# Patient Record
Sex: Female | Born: 1945 | Race: White | Hispanic: No | Marital: Married | Smoking: Former smoker
Health system: Southern US, Community
[De-identification: ages and names within clinical notes are randomized; demographics above are authoritative.]

## PROBLEM LIST (undated history)

## (undated) DIAGNOSIS — F329 Major depressive disorder, single episode, unspecified: Secondary | ICD-10-CM

## (undated) DIAGNOSIS — M8580 Other specified disorders of bone density and structure, unspecified site: Secondary | ICD-10-CM

## (undated) DIAGNOSIS — E785 Hyperlipidemia, unspecified: Secondary | ICD-10-CM

## (undated) DIAGNOSIS — G4730 Sleep apnea, unspecified: Secondary | ICD-10-CM

## (undated) DIAGNOSIS — F419 Anxiety disorder, unspecified: Secondary | ICD-10-CM

## (undated) DIAGNOSIS — F32A Depression, unspecified: Secondary | ICD-10-CM

## (undated) DIAGNOSIS — M1990 Unspecified osteoarthritis, unspecified site: Secondary | ICD-10-CM

## (undated) DIAGNOSIS — E559 Vitamin D deficiency, unspecified: Secondary | ICD-10-CM

## (undated) DIAGNOSIS — I10 Essential (primary) hypertension: Secondary | ICD-10-CM

## (undated) DIAGNOSIS — Z8541 Personal history of malignant neoplasm of cervix uteri: Secondary | ICD-10-CM

## (undated) DIAGNOSIS — J449 Chronic obstructive pulmonary disease, unspecified: Secondary | ICD-10-CM

## (undated) HISTORY — DX: Depression, unspecified: F32.A

## (undated) HISTORY — DX: Anxiety disorder, unspecified: F41.9

## (undated) HISTORY — DX: Essential (primary) hypertension: I10

## (undated) HISTORY — DX: Personal history of malignant neoplasm of cervix uteri: Z85.41

## (undated) HISTORY — DX: Unspecified osteoarthritis, unspecified site: M19.90

## (undated) HISTORY — DX: Hyperlipidemia, unspecified: E78.5

## (undated) HISTORY — DX: Vitamin D deficiency, unspecified: E55.9

## (undated) HISTORY — DX: Major depressive disorder, single episode, unspecified: F32.9

## (undated) HISTORY — DX: Chronic obstructive pulmonary disease, unspecified: J44.9

## (undated) HISTORY — DX: Sleep apnea, unspecified: G47.30

## (undated) HISTORY — DX: Other specified disorders of bone density and structure, unspecified site: M85.80

---
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
## 1979-12-21 HISTORY — PX: CHOLECYSTECTOMY: SHX55

## 1980-12-20 HISTORY — PX: ABDOMINAL HYSTERECTOMY: SHX81

## 2001-12-20 LAB — HM COLONOSCOPY

## 2013-02-22 LAB — HM PAP SMEAR: HM Pap smear: NEGATIVE

## 2016-12-24 LAB — LIPID PANEL
Cholesterol: 153 (ref 0–200)
Cholesterol: 153 (ref 0–200)
HDL: 61 (ref 35–70)
LDL Cholesterol: 77
Triglycerides: 104 (ref 40–160)

## 2017-02-17 LAB — HM MAMMOGRAPHY

## 2017-02-17 LAB — HM DEXA SCAN: HM Dexa Scan: ABNORMAL

## 2017-07-12 ENCOUNTER — Encounter: Payer: Self-pay | Admitting: Nurse Practitioner

## 2017-07-12 ENCOUNTER — Ambulatory Visit (INDEPENDENT_AMBULATORY_CARE_PROVIDER_SITE_OTHER): Payer: Medicare Other | Admitting: Nurse Practitioner

## 2017-07-12 VITALS — BP 125/61 | HR 87 | Temp 98.1°F | Ht 63.0 in | Wt 175.2 lb

## 2017-07-12 DIAGNOSIS — F32A Depression, unspecified: Secondary | ICD-10-CM | POA: Insufficient documentation

## 2017-07-12 DIAGNOSIS — F324 Major depressive disorder, single episode, in partial remission: Secondary | ICD-10-CM

## 2017-07-12 DIAGNOSIS — E785 Hyperlipidemia, unspecified: Secondary | ICD-10-CM | POA: Diagnosis not present

## 2017-07-12 DIAGNOSIS — E559 Vitamin D deficiency, unspecified: Secondary | ICD-10-CM

## 2017-07-12 DIAGNOSIS — F329 Major depressive disorder, single episode, unspecified: Secondary | ICD-10-CM | POA: Insufficient documentation

## 2017-07-12 DIAGNOSIS — J449 Chronic obstructive pulmonary disease, unspecified: Secondary | ICD-10-CM | POA: Insufficient documentation

## 2017-07-12 DIAGNOSIS — Z7689 Persons encountering health services in other specified circumstances: Secondary | ICD-10-CM

## 2017-07-12 DIAGNOSIS — J42 Unspecified chronic bronchitis: Secondary | ICD-10-CM | POA: Diagnosis not present

## 2017-07-12 DIAGNOSIS — Z8541 Personal history of malignant neoplasm of cervix uteri: Secondary | ICD-10-CM | POA: Insufficient documentation

## 2017-07-12 DIAGNOSIS — M8589 Other specified disorders of bone density and structure, multiple sites: Secondary | ICD-10-CM | POA: Diagnosis not present

## 2017-07-12 DIAGNOSIS — I10 Essential (primary) hypertension: Secondary | ICD-10-CM | POA: Diagnosis not present

## 2017-07-12 DIAGNOSIS — M8580 Other specified disorders of bone density and structure, unspecified site: Secondary | ICD-10-CM | POA: Insufficient documentation

## 2017-07-12 LAB — COMPREHENSIVE METABOLIC PANEL
ALT: 17 U/L (ref 6–29)
AST: 18 U/L (ref 10–35)
Albumin: 4.3 g/dL (ref 3.6–5.1)
Alkaline Phosphatase: 117 U/L (ref 33–130)
BUN: 15 mg/dL (ref 7–25)
CO2: 24 mmol/L (ref 20–31)
Calcium: 9.6 mg/dL (ref 8.6–10.4)
Chloride: 102 mmol/L (ref 98–110)
Creat: 0.65 mg/dL (ref 0.50–0.99)
Glucose, Bld: 87 mg/dL (ref 65–99)
Potassium: 4.3 mmol/L (ref 3.5–5.3)
Sodium: 137 mmol/L (ref 135–146)
Total Bilirubin: 0.5 mg/dL (ref 0.2–1.2)
Total Protein: 6.7 g/dL (ref 6.1–8.1)

## 2017-07-12 MED ORDER — LISINOPRIL 30 MG PO TABS: 30.0000 mg | ORAL_TABLET | Freq: Every day | ORAL | 1 refills | Status: DC

## 2017-07-12 MED ORDER — SIMVASTATIN 40 MG PO TABS: 40.0000 mg | ORAL_TABLET | Freq: Every day | ORAL | 1 refills | Status: DC

## 2017-07-12 MED ORDER — BUPROPION HCL ER (XL) 300 MG PO TB24: 300.0000 mg | ORAL_TABLET | Freq: Every day | ORAL | 1 refills | Status: DC

## 2017-07-12 MED ORDER — ALBUTEROL SULFATE HFA 108 (90 BASE) MCG/ACT IN AERS: 1.0000 | INHALATION_SPRAY | Freq: Four times a day (QID) | RESPIRATORY_TRACT | 2 refills | Status: DC | PRN

## 2017-07-12 MED ORDER — BUDESONIDE-FORMOTEROL FUMARATE 160-4.5 MCG/ACT IN AERO: 2.0000 | INHALATION_SPRAY | Freq: Two times a day (BID) | RESPIRATORY_TRACT | 5 refills | Status: DC

## 2017-07-12 NOTE — Assessment & Plan Note (Signed)
Controlled in office today.  Pt reports long-term stable dose of medications.  Follows low salt diet.  Plan: 1. Continue lisinopril 30 mg once daily 2. Continue cholesterol and aspirin. 3. Last labs approximately months ago.  Recheck CMP today.  Pt not fasting - defer lipid to next appointment. 4. Encouraged heart healthy diet and regular exercise. 5. Follow up 6 months.

## 2017-07-12 NOTE — Assessment & Plan Note (Signed)
Pt w/ history of vitamin D deficiency and currently taking 5,000 IU Vitamin D3.  No recent check of vitamin D levels w/ labs from January and March.  Plan: 1. Continue vitamin D3 5,000 IU for now. 2. Recheck vitamin D, 25 today assess need for current vitamin D dosage. 3. Follow up 6 months - 1 year.

## 2017-07-12 NOTE — Patient Instructions (Addendum)
Lindsey Webb, Thank you for coming in to clinic today.  1. For your medicines: - We are not making any changes today.  - Get back on your Symbicort and let me know if you are still having shortness of breath after 2 weeks of restarting this medication.   Please schedule a follow-up appointment with Wilhelmina McardleLauren Jonah Nestle, AGNP. Return in about 6 months (around 01/12/2018) for COPD, BP, cholesterol.  If you have any other questions or concerns, please feel free to call the clinic or send a message through MyChart. You may also schedule an earlier appointment if necessary.  You will receive a survey after today's visit either digitally by e-mail or paper by [employer] mail. Your experiences and feedback matter to us.  Please respond so we know how we are doing as we provide care for you.   Wilhelmina McardleLauren Kaziah Krizek, DNP, AGNP-BC Adult Gerontology Nurse Practitioner [facility], [facility]

## 2017-07-12 NOTE — Assessment & Plan Note (Signed)
Stable by last lab check.  Pt taking simvastatin and monitoring diet.  Plan: 1. Continue simvastatin 40 mg once daily. 2. Encouraged heart healthy diet. 3. Recheck in 6 months.

## 2017-07-12 NOTE — Progress Notes (Signed)
I have reviewed this encounter including the documentation in this note and/or discussed this patient with the provider, Wilhelmina McardleLauren Kennedy, AGPCNP-BC. I am certifying that I agree with the content of this note as supervising physician.  Saralyn PilarAlexander Karamalegos, DO [facility] 07/12/2017, 5:21 PM

## 2017-07-12 NOTE — Assessment & Plan Note (Signed)
Stable on wellbutrin XL 300 mg once daily.  Pt w/ PHQ score 11 today.  Regular situations where pt wants to do nothing.  Not affecting relationships or ability to accomplish tasks.  Plan: 1. Discussed increasing or adding medications.  Pt declines today. 2. Encouraged stress management techniques, setting boundaries for quiet time and limiting how much time is spent alone. 3. Encouraged physical activity. 4. Follow up 6 months or sooner if needed.  Pt verbalizes understanding to contact sooner if persistent symptoms of anhedonia.

## 2017-07-12 NOTE — Progress Notes (Signed)
Subjective:    Patient ID: Lindsey Webb, female    DOB: December 09, 1946, 71 y.o.   MRN: [id number]  Karleen Seebeck is a 71 y.o. female presenting on 07/12/2017 for Establish Care   HPI Establish Care New Provider Pt last seen by PCP 7 moths ago.  Obtain records from [facility].   COPD  Pt has stopped smoking about 1.5 years ago.  She has a 45 pack year smoking history.  Prior to quitting, she notes she had frequent respiratory illness.  She had seen a pulmonologist in past and has not had anyrecent PFTs. She has previously been stable on Symbicort 2 puffs 2x daily and albuterol prn.  Uses albuterol 2-3 x per week when taking Symbicort.  Currently 2-3 x per day. Pt is out of Symbicort and needs a refill today.  Since being off Symbicort, she has stopped walking, going to the gym, and has had to limit activities.  She notes worsening symptoms w/ hot and humid days.  She had been walking or going to the gym 4-5 days per week.  BP She is not checking BP at home.    Current medications: lisinopril 30 mg once daily, tolerating well without side effects Pt denies headache, lightheadedness, dizziness, changes in vision, chest tightness/pressure, palpitations, leg swelling, sudden loss of speech or loss of consciousness.  Cholesterol Pt notes recent dietary indiscretions.  She has remarried 2 months ago and has since gained weight.  It also corresponds to reduction in physical activity r/t COPD.  She denies any s/sx of stroke, MI as above.  Depression Pt notes she has periods of time most days where she doesn't feel like doing anything or being with other people.  It is something she notes her new husband encourages her to stop doing.  Otherwise, she feels well controlled and is requesting to continue current doses of Wellbutrin.  She is taking bupropion 300 mg 24 hr tablet once daily.  The other predominant concern for pt is change in sleep patterns.  She has a hard time sleeping across the sleep  cycle.  Depression screen PHQ 2/9 07/12/2017  Decreased Interest 1  Down, Depressed, Hopeless 1  PHQ - 2 Score 2  Altered sleeping 3  Tired, decreased energy 1  Change in appetite 3  Feeling bad or failure about yourself  1  Trouble concentrating 1  Moving slowly or fidgety/restless 0  Suicidal thoughts 0  PHQ-9 Score 11   Prediabetes Pt has been told in past that she needed to watch her sugar intake.  She does not have A1c value with her today, but last fasting glucose level in January 2018 was 101. Pt is already on lisinopril and simvastatin for management of BP and cholesterol.  Past Medical History:  Diagnosis Date  . Anxiety   . Arthritis    osteoarthritis  . COPD (chronic obstructive pulmonary disease) (HCC)   . Depression   . History of cervical cancer   . Hyperlipidemia   . Hypertension   . Osteopenia   . Sleep apnea   . Vitamin D deficiency    Past Surgical History:  Procedure Laterality Date  . ABDOMINAL HYSTERECTOMY  1982  . CHOLECYSTECTOMY  1981  . TONSILLECTOMY  1962   Social History   Social History  . Marital status: Married    Spouse name: N/A  . Number of children: N/A  . Years of education: N/A   Occupational History  . Not on file.   Social History  Main Topics  . Smoking status: Former Smoker    Years: 45.00    Types: Cigarettes    Quit date: 01/13/2016  . Smokeless tobacco: Never Used  . Alcohol use No  . Drug use: No  . Sexual activity: Yes    Birth control/ protection: Post-menopausal, Surgical   Other Topics Concern  . Not on file   Social History Narrative  . No narrative on file   Family History  Problem Relation Age of Onset  . Prostate cancer Father   . Brain cancer Father   . Colon polyps Father   . Brain cancer Mother   . Brain cancer Paternal Grandmother    No current outpatient prescriptions on file prior to visit.   No current facility-administered medications on file prior to visit.     Review of Systems    Constitutional: Negative.   HENT: Negative.   Eyes: Negative.   Respiratory: Positive for shortness of breath.   Cardiovascular: Negative.   Gastrointestinal: Negative.   Endocrine: Negative.   Genitourinary: Negative.   Musculoskeletal: Negative.   Skin: Negative.   Allergic/Immunologic: Negative.   Neurological: Negative.   Hematological: Negative.   Psychiatric/Behavioral: Negative.  Negative for suicidal ideas.   Per HPI unless specifically indicated above     Objective:    BP 125/61 (BP Location: Right Arm, Patient Position: Sitting, Cuff Size: Normal)   Pulse 87   Temp 98.1 F (36.7 C) (Oral)   Ht 5\' 3"  (1.6 m)   Wt 175 lb 3.2 oz (79.5 kg)   BMI 31.04 kg/m   Wt Readings from Last 3 Encounters:  07/12/17 175 lb 3.2 oz (79.5 kg)    Physical Exam  General - overweight, well-appearing, NAD HEENT - Normocephalic, atraumatic Neck - supple, non-tender, no LAD, no thyromegaly, no carotid bruit Heart - RRR, no murmurs heard Lungs - Clear throughout all lobes, no wheezing, crackles, or rhonchi. Normal work of breathing. Abdomen - soft, NTND, no masses, no hepatosplenomegaly, active bowel sounds Extremeties - non-tender, no edema, cap refill < 2 seconds, peripheral pulses intact +2 bilaterally Skin - warm, dry, no rashes Neuro - awake, alert, oriented x3, CN II-X intact, intact muscle strength 5/5 bilaterally, intact distal sensation to light touch, normal coordination, normal gait Psych - Normal mood and affect, normal behavior   Results for orders placed or performed in visit on 07/12/17  HM MAMMOGRAPHY  Result Value Ref Range   HM Mammogram 0-4 Bi-Rad 0-4 Bi-Rad, Self Reported Normal  HM DEXA SCAN  Result Value Ref Range   HM Dexa Scan abnormal   Lipid panel  Result Value Ref Range   Cholesterol 153 0 - 200  Lipid panel  Result Value Ref Range   Triglycerides 104 40 - 160   Cholesterol 153 0 - 200   HDL 61 35 - 70   LDL Cholesterol 77   HM COLONOSCOPY   Result Value Ref Range   HM Colonoscopy Patient Reported See Report (in chart), Patient Reported      Assessment & Plan:   Problem List Items Addressed This Visit      Cardiovascular and Mediastinum   Hypertension    Controlled in office today.  Pt reports long-term stable dose of medications.  Follows low salt diet.  Plan: 1. Continue lisinopril 30 mg once daily 2. Continue cholesterol and aspirin. 3. Last labs approximately months ago.  Recheck CMP today.  Pt not fasting - defer lipid to next appointment. 4. Encouraged heart  healthy diet and regular exercise. 5. Follow up 6 months.      Relevant Medications   aspirin EC 81 MG tablet   lisinopril (PRINIVIL,ZESTRIL) 30 MG tablet   simvastatin (ZOCOR) 40 MG tablet   Other Relevant Orders   Comprehensive metabolic panel     Respiratory   COPD (chronic obstructive pulmonary disease) (HCC)    Uncontrolled r/t pt being out of Symbicort and only using albuterol for management.  Pt previously well controlled and has continued to remain free of tobacco for last 1.5 years.  Plan: 1. Refill Symbicort.  Continue taking 2 puffs 2 times daily. 2. Refill albuterol.  Use only as rescue inhaler. 3. Repeat PFTs.  Refer to pulmonology if worsening COPD or needing escalation of medical therapy. 4. Follow up 6 months or sooner if needed.      Relevant Medications   budesonide-formoterol (SYMBICORT) 160-4.5 MCG/ACT inhaler   albuterol (PROVENTIL HFA;VENTOLIN HFA) 108 (90 Base) MCG/ACT inhaler   Other Relevant Orders   Pulmonary function test     Musculoskeletal and Integument   Osteopenia    Pt w/ last DEXA scan in March 2018 indicating osteopenia.  Pt taking calcium and vitamin D supplements.  Plan: 1. Continue calcium and vitamin D 2. Encouraged weight bearing activity. 3. Fall prevention discussed. 4. Follow up 1 year.  Plan repeat DEXA in 3 years.      Relevant Orders   Vitamin D (25 hydroxy)     Other   Depression -  Primary    Stable on wellbutrin XL 300 mg once daily.  Pt w/ PHQ score 11 today.  Regular situations where pt wants to do nothing.  Not affecting relationships or ability to accomplish tasks.  Plan: 1. Discussed increasing or adding medications.  Pt declines today. 2. Encouraged stress management techniques, setting boundaries for quiet time and limiting how much time is spent alone. 3. Encouraged physical activity. 4. Follow up 6 months or sooner if needed.  Pt verbalizes understanding to contact sooner if persistent symptoms of anhedonia.      Relevant Medications   buPROPion (WELLBUTRIN XL) 300 MG 24 hr tablet   Hyperlipidemia    Stable by last lab check.  Pt taking simvastatin and monitoring diet.  Plan: 1. Continue simvastatin 40 mg once daily. 2. Encouraged heart healthy diet. 3. Recheck in 6 months.      Relevant Medications   aspirin EC 81 MG tablet   lisinopril (PRINIVIL,ZESTRIL) 30 MG tablet   simvastatin (ZOCOR) 40 MG tablet   Other Relevant Orders   Comprehensive metabolic panel   Vitamin D deficiency    Pt w/ history of vitamin D deficiency and currently taking 5,000 IU Vitamin D3.  No recent check of vitamin D levels w/ labs from January and March.  Plan: 1. Continue vitamin D3 5,000 IU for now. 2. Recheck vitamin D, 25 today assess need for current vitamin D dosage. 3. Follow up 6 months - 1 year.      Relevant Orders   Vitamin D (25 hydroxy)    Other Visit Diagnoses    Encounter to establish care     Prior PCP w/ [facility] - Records will be available in CareEverywhere.  Pt brings last tests and office visit note with her today for reference.      Meds ordered this encounter  Medications  . calcium carbonate (CALCIUM 600) 600 MG TABS tablet    Sig: Take 600 mg by mouth 2 (  two) times daily with a meal.  . aspirin EC 81 MG tablet    Sig: Take 81 mg by mouth daily.  . cholecalciferol (VITAMIN D) 400 units TABS tablet    Sig: Take  5,000 Units by mouth daily.  Marland Kitchen. lisinopril (PRINIVIL,ZESTRIL) 30 MG tablet    Sig: Take 1 tablet (30 mg total) by mouth daily.    Dispense:  90 tablet    Refill:  1    Order Specific Question:   Supervising Provider    Answer:   Smitty CordsKARAMALEGOS, ALEXANDER J [[id number]]  . simvastatin (ZOCOR) 40 MG tablet    Sig: Take 1 tablet (40 mg total) by mouth daily.    Dispense:  90 tablet    Refill:  1    Order Specific Question:   Supervising Provider    Answer:   Smitty CordsKARAMALEGOS, ALEXANDER J [[id number]]  . budesonide-formoterol (SYMBICORT) 160-4.5 MCG/ACT inhaler    Sig: Inhale 2 puffs into the lungs 2 (two) times daily.    Dispense:  1 Inhaler    Refill:  5    Order Specific Question:   Supervising Provider    Answer:   Smitty CordsKARAMALEGOS, ALEXANDER J [[id number]]  . albuterol (PROVENTIL HFA;VENTOLIN HFA) 108 (90 Base) MCG/ACT inhaler    Sig: Inhale 1 puff into the lungs every 6 (six) hours as needed for wheezing or shortness of breath.    Dispense:  1 Inhaler    Refill:  2    Product selection (Brand) permitted    Order Specific Question:   Supervising Provider    Answer:   Smitty CordsKARAMALEGOS, ALEXANDER J [[id number]]  . buPROPion (WELLBUTRIN XL) 300 MG 24 hr tablet    Sig: Take 1 tablet (300 mg total) by mouth daily.    Dispense:  90 tablet    Refill:  1    Order Specific Question:   Supervising Provider    Answer:   Smitty CordsKARAMALEGOS, ALEXANDER J [[id number]]    Follow up plan: Return in about 6 months (around 01/12/2018) for COPD, BP, cholesterol.  Wilhelmina McardleLauren Jeovanny Cuadros, DNP, AGPCNP-BC Adult Gerontology Primary Care Nurse Practitioner [facility] 07/12/2017, 5:15 PM

## 2017-07-12 NOTE — Assessment & Plan Note (Signed)
Uncontrolled r/t pt being out of Symbicort and only using albuterol for management.  Pt previously well controlled and has continued to remain free of tobacco for last 1.5 years.  Plan: 1. Refill Symbicort.  Continue taking 2 puffs 2 times daily. 2. Refill albuterol.  Use only as rescue inhaler. 3. Repeat PFTs.  Refer to pulmonology if worsening COPD or needing escalation of medical therapy. 4. Follow up 6 months or sooner if needed.

## 2017-07-12 NOTE — Assessment & Plan Note (Signed)
Pt w/ last DEXA scan in March 2018 indicating osteopenia.  Pt taking calcium and vitamin D supplements.  Plan: 1. Continue calcium and vitamin D 2. Encouraged weight bearing activity. 3. Fall prevention discussed. 4. Follow up 1 year.  Plan repeat DEXA in 3 years.

## 2017-07-13 LAB — VITAMIN D 25 HYDROXY (VIT D DEFICIENCY, FRACTURES): Vit D, 25-Hydroxy: 37 ng/mL (ref 30–100)

## 2017-08-10 ENCOUNTER — Other Ambulatory Visit: Payer: Self-pay

## 2017-09-19 ENCOUNTER — Encounter: Payer: Self-pay | Admitting: Nurse Practitioner

## 2017-09-19 ENCOUNTER — Ambulatory Visit: Payer: Self-pay

## 2017-09-19 ENCOUNTER — Ambulatory Visit (INDEPENDENT_AMBULATORY_CARE_PROVIDER_SITE_OTHER): Payer: Medicare Other | Admitting: Nurse Practitioner

## 2017-09-19 VITALS — BP 129/67 | HR 104 | Temp 98.0°F | Ht 63.0 in | Wt 173.0 lb

## 2017-09-19 DIAGNOSIS — R05 Cough: Secondary | ICD-10-CM | POA: Diagnosis not present

## 2017-09-19 DIAGNOSIS — Z20818 Contact with and (suspected) exposure to other bacterial communicable diseases: Secondary | ICD-10-CM | POA: Diagnosis not present

## 2017-09-19 DIAGNOSIS — R059 Cough, unspecified: Secondary | ICD-10-CM

## 2017-09-19 MED ORDER — AZITHROMYCIN 250 MG PO TABS: ORAL_TABLET | ORAL | 0 refills | Status: DC

## 2017-09-19 MED ORDER — BENZONATATE 100 MG PO CAPS: 100.0000 mg | ORAL_CAPSULE | Freq: Two times a day (BID) | ORAL | 0 refills | Status: DC | PRN

## 2017-09-19 NOTE — Patient Instructions (Signed)
Lindsey Webb, Thank you for coming in to clinic today.  1. You likely have pertussis: - START azithromycin  tablets.  Take one tablet twice today, then take one tablet once daily for 4 days. - While you are on an antibiotic, take a probiotic.  Antibiotics kill good and bad bacteria.  A probiotic helps to replace your good bacteria. Probiotic pills can be found over the counter.  One brand is Florastor, but you can use any brand you prefer.  You can also get good bacteria from foods like yogurt. - Drink plenty of fluids.  - Start anti-histamine loratadine or cetirizine  daily.  These are generic for Claritin and Zyrtec. - Drink plenty of fluids. - Start taking Tylenol extra strength 1 to 2 tablets every 6-8 hours for aches or fever/chills for next few days as needed.  Do not take more than 3,000 mg in 24 hours from all medicines.  You may also take ibuprofen 200-400mg  every 8 hours as needed.   - Drink warm herbal tea with honey for sore throat.   If symptoms are significantly worse with persistent fevers/chills despite tylenol/ibpurofen, nausea, vomiting unable to tolerate food/fluids or medicine, body aches, or shortness of breath, sinus pain pressure or worsening productive cough, then follow-up for re-evaluation, may seek more immediate care at Urgent Care or the ED if you are more concerned that it is an emergency.  2. Come back in about 2 weeks for your flu and Tdap vaccines.  Please schedule a follow-up appointment with [name], AGNP. Return if symptoms worsen or fail to improve.  If you have any other questions or concerns, please feel free to call the clinic or send a message through MyChart. You may also schedule an earlier appointment if necessary.  You will receive a survey after today's visit either digitally by e-mail or paper by [employer]. Your experiences and feedback matter to [place].  Please respond so we know how we are doing as we provide care for you.   [name],  DNP, AGNP-BC Adult Gerontology Nurse Practitioner [facility], [facility]    Pertussis, Adult Pertussis, also called whooping cough, is an infection that causes severe and sudden coughing attacks. Pertussis spreads easily from person to person (is contagious). It spreads through the droplets that are sprayed in the air when an infected person talks, coughs, or sneezes. Symptoms of pertussis can last for up to 6 weeks, even though the cough starts to get better. It may take as long as 6 months for the cough to go away completely. What are the causes? This condition is caused by bacteria. The bacteria can spread to someone when he or she:  Inhales droplets that have been sprayed in the air by an infected person.  Touches a surface where the droplets fell and then touches his or her mouth or nose.  What are the signs or symptoms? Symptoms of this condition include cold-like symptoms, such as:  A runny nose.  Low fever.  Mild cough.  Red, watery eyes.  These symptoms develop at the beginning of the infection. After 1-2 weeks, the cold symptoms get better, but the cough worsens, and severe and sudden coughing attacks frequently develop. During these attacks, people may cough so hard that vomiting occurs. How is this diagnosed? This condition may be diagnosed by:  A physical exam.  Lab tests of mucus from the nose and throat.  A blood test.  A chest X-ray.  How is this treated? This  condition is treated with antibiotic medicines.  Starting antibiotics quickly may help shorten the illness and make it less contagious.  Antibiotics may also be prescribed for everyone who lives in the same household.   Immunization may be recommended for people in the household who are at risk of developing pertussis. At-risk groups include: ? Infants. ? Those who have not had their full course of pertussis immunizations. ? Those who were immunized but have not had their recent  booster shot. Mild coughing may continue for months after the infection is treated. This coughing may be due to the remaining soreness and inflammation in the lungs. Follow these instructions at home: Medicines   Take over-the-counter and prescription medicines only as told by your health care provider.  Take your antibiotic medicine as told by your health care provider. Do not stop taking the antibiotic even if you start to feel better.  Do not take cough medicine unless told by your health care provider. Coughing attack  If you are having a coughing attack: ? Raise (elevate) the head of your mattress or raise your head with pillows to improve breathing. This will also make it easier to clear out mucus that is brought up from the lungs to the throat during a cough (sputum). ? Sit upright.  Avoid substances that may irritate the lungs, such as smoke, aerosols, or fumes. These substances may make your coughing worse.  Use a cool mist humidifier at home to increase air moisture. This will soothe your cough and help to loosen sputum. Do not use hot steam. Prevent infection  For the first 5 days of antibiotic treatment, stay away from those who are at risk of developing pertussis. If no antibiotics are prescribed, stay at home for the first 3 weeks that you are coughing or as told by your health care provider.  Do not go to work until you have been treated with antibiotics for 5 days. If no antibiotics are prescribed, do not go to work for the first 3 weeks that you are coughing or as told by your health care provider. Tell your workplace that you were diagnosed with pertussis.  Wash your hands often with soap and water. Everyone in your household should also wash hands often to avoid spreading the infection. If soap and water are not available, use hand sanitizer. General instructions  Rest as much as possible. Slowly go back to your normal activities as told by your health care  provider.  Drink enough fluid to keep your urine clear or pale yellow.  Keep all follow-up visits as told by your health care provider. This is important. How is this prevented?  Pertussis can be prevented with a vaccine and later booster shots.  The pertussis vaccine is given during childhood.  If you are an adult who was never vaccinated, get vaccinated as soon as possible.  If you are an adult who was previously vaccinated, talk with your health care providers about the need for a booster shot because immunity from the vaccine decreases over time.  All of the following people should consider receiving a booster dose of pertussis: ? Pregnant women. ? Everyone who has or will have close contact with an infant who is less than 14 months old. ? All health care personnel. Contact a health care provider if:  You cannot stop vomiting.  You are not able to eat or drink.  Your cough does not improve.  You have a fever. Get help right away if:  Your face turns red or blue during a coughing attack.  You pass out after a coughing attack, even if only for a few moments.  Your breathing stops for a period of time (apnea).  You are restless or cannot sleep.  You feel sluggish or you are sleeping too much. This information is not intended to replace advice given to you by your health care provider. Make sure you discuss any questions you have with your health care provider. Document Released: 04/02/2013 Document Revised: 06/26/2016 Document Reviewed: 05/24/2016 Elsevier Interactive Patient Education  [employer].

## 2017-09-19 NOTE — Progress Notes (Signed)
Subjective:    Patient ID: Lindsey Webb, female    DOB: Jul 30, 1946, 71 y.o.   MRN: [id number]  Lindsey Webb is a 71 y.o. female presenting on 09/19/2017 for Cough (x 1 week, expose to pertussis)   HPI Cough Known sick contact w/ pertussus - Grandson 4 yr contracted at preschool.  Also in home are a 58yr, 19 mo old. Cough started about 1 weeks.  Grandson's cough started also about 1-2 weeks ago.   - Cough worst at night when lying down, but has mild cough during day.   - Has had significant fatigue, achy all over. - Pt also having some mild shortness of breath that resolves w/ rest. - Denies fever, chills , sweats.     Tdap > 10 years ago.  Last vaccine was Td.  Social History  Substance Use Topics  . Smoking status: Former Smoker    Years: 45.00    Types: Cigarettes    Quit date: 01/13/2016  . Smokeless tobacco: Never Used  . Alcohol use No    Review of Systems Per HPI unless specifically indicated above     Objective:    BP 129/67 (BP Location: Right Arm, Patient Position: Sitting, Cuff Size: Normal)   Pulse (!) 104   Temp 98 F (36.7 C) (Oral)   Ht  (1.6 m)   Wt 173 lb (78.5 kg)   SpO2 98%   BMI 30.65 kg/m   Wt Readings from Last 3 Encounters:  09/19/17 173 lb (78.5 kg)  07/12/17 175 lb 3.2 oz (79.5 kg)    Physical Exam  General - overweight, ill-appearing (fatigued), NAD HEENT - Normocephalic, atraumatic Neck - supple, non-tender, cervical LAD, Heart - RRR, no murmurs heard Lungs - Clear throughout all lobes, no wheezing, crackles, or rhonchi. Normal work of breathing. Extremeties - non-tender, no edema, cap refill < 2 seconds, peripheral pulses intact +2 bilaterally Skin - warm, dry Neuro - awake, alert, oriented x3, normal gait Psych - Normal mood and affect, normal behavior   Results for orders placed or performed in visit on 08/10/17  HM PAP SMEAR  Result Value Ref Range   HM Pap smear neg pap, neg HPV       Assessment & Plan:   Problem List  Items Addressed This Visit    None    Visit Diagnoses    Cough    -  Primary   Likely secondary to pertussis after known exposure to confirmed Pertussis case.  Previously reported to health department.  Plan: 1. Treat symptomatically - Start Mucinex-DM OTC up to 7-10 days then stop - Start tessalon perles 100 mg every 12 hours as needed for cough. - Supportive care with nasal saline, warm herbal tea with honey, - Improve hydration - Tylenol / Motrin PRN fevers 2. Return criteria given   Relevant Medications   benzonatate (TESSALON) 100 MG capsule   Pertussis exposure     Known pertussis exposure.  Pt now w/ acute symptoms consistent w/ pertussis infection in adults.  Plan: 1. Treat cough as above. 2. START azithromycin 250 mg 1 tab twice today.  Repeat 1 tab once daily x 4 days. 3. Reviewed transmission. Avoid contact w/ immunocompromised individuals and those unvaccinated. 4. Recommend repeat Tdap in about 2 weeks.  Return to clinic.   Relevant Medications   benzonatate (TESSALON) 100 MG capsule   azithromycin (ZITHROMAX) 250 MG tablet      Meds ordered this encounter  Medications  . benzonatate (TESSALON)  100 MG capsule    Sig: Take 1 capsule (100 mg total) by mouth 2 (two) times daily as needed for cough.    Dispense:  30 capsule    Refill:  0    Order Specific Question:   Supervising Provider    Answer:   Smitty Cords [[id number]]  . azithromycin (ZITHROMAX) 250 MG tablet    Sig: Take one tablet twice today.  Then take 1 tablet daily for 4 days.    Dispense:  6 tablet    Refill:  0    Order Specific Question:   Supervising Provider    Answer:   Smitty Cords [[id number]]      Follow up plan: Return if symptoms worsen or fail to improve.  [name], DNP, AGPCNP-BC Adult Gerontology Primary Care Nurse Practitioner [facility] 09/19/2017, 3:15 PM

## 2017-10-03 ENCOUNTER — Ambulatory Visit: Payer: Medicare Other

## 2018-01-12 ENCOUNTER — Other Ambulatory Visit: Payer: Self-pay

## 2018-01-12 ENCOUNTER — Ambulatory Visit: Payer: Medicare Other | Admitting: Nurse Practitioner

## 2018-01-12 ENCOUNTER — Encounter: Payer: Self-pay | Admitting: Nurse Practitioner

## 2018-01-12 DIAGNOSIS — I10 Essential (primary) hypertension: Secondary | ICD-10-CM | POA: Diagnosis not present

## 2018-01-12 DIAGNOSIS — F324 Major depressive disorder, single episode, in partial remission: Secondary | ICD-10-CM

## 2018-01-12 DIAGNOSIS — J42 Unspecified chronic bronchitis: Secondary | ICD-10-CM | POA: Diagnosis not present

## 2018-01-12 DIAGNOSIS — E785 Hyperlipidemia, unspecified: Secondary | ICD-10-CM

## 2018-01-12 MED ORDER — LISINOPRIL 30 MG PO TABS: 30.0000 mg | ORAL_TABLET | Freq: Every day | ORAL | 1 refills | Status: DC

## 2018-01-12 MED ORDER — BUDESONIDE-FORMOTEROL FUMARATE 160-4.5 MCG/ACT IN AERO: 1.0000 | INHALATION_SPRAY | Freq: Two times a day (BID) | RESPIRATORY_TRACT | 5 refills | Status: DC

## 2018-01-12 MED ORDER — BUPROPION HCL ER (XL) 300 MG PO TB24: 300.0000 mg | ORAL_TABLET | Freq: Every day | ORAL | 1 refills | Status: DC

## 2018-01-12 MED ORDER — SIMVASTATIN 40 MG PO TABS: 40.0000 mg | ORAL_TABLET | Freq: Every day | ORAL | 1 refills | Status: DC

## 2018-01-12 NOTE — Progress Notes (Signed)
Subjective:    Patient ID: Lindsey Webb, female    DOB: 06-04-1946, 72 y.o.   MRN: [id number]  Lindsey DrownLinda Webb is a 72 y.o. female presenting on 01/12/2018 for COPD and Hypertension   HPI COPD Today is celebration of 2 years quitting smoking.  Pt is using Symbicort 2 puffs once daily rather than as prescribed (twice daily).   - Albuterol takes when she is near people/or is frying foods, other irritants (perfume, odors, dusts) when away from home.  Not taking daily, but does have relief when she needs it.  Hypertension - She is not checking BP at home or outside of clinic.    - Current medications: lisinopril 30mg  once daily, tolerating well without side effects - She is not currently symptomatic. - Pt denies headache, lightheadedness, dizziness, changes in vision, chest tightness/pressure, palpitations, leg swelling, sudden loss of speech or loss of consciousness. - She  reports an exercise routine that includes total fitness, 3 days per week. - Her diet is moderate in salt, moderate in fat, and moderate in carbohydrates.  Is drinking soda only once per day instead of daily.  Also cutting out cookies.    Hyperlipidemia - Has taken simvastatin for many years.  Dose was increased a couple years ago.  Is tolerating it well without myalgias or other side effects. - Pt denies changes in vision, chest tightness/pressure, palpitations, shortness of breath, leg pain while walking, leg or arm weakness, and sudden loss of speech or loss of consciousness.  Depression Pt reports being very stable on Wellbutrin and is tolerating well without side effects.  Reports she needs a refill of medication.  Has had no prolonged periods of being down, depressed.  Denies any periods of anhedonia, SI/HI.    Social History   Tobacco Use  . Smoking status: Former Smoker    Years: 45.00    Types: Cigarettes    Last attempt to quit: 01/13/2016    Years since quitting: 2.0  . Smokeless tobacco: Never Used  Substance  Use Topics  . Alcohol use: No  . Drug use: No   Review of Systems Per HPI unless specifically indicated above     Objective:    BP (!) 121/51 (BP Location: Right Arm, Patient Position: Sitting, Cuff Size: Large)   Pulse 89   Temp 97.9 F (36.6 C) (Oral)   Ht 5\' 3"  (1.6 m)   Wt 186 lb (84.4 kg)   SpO2 98%   BMI 32.95 kg/m   Wt Readings from Last 3 Encounters:  01/12/18 186 lb (84.4 kg)  09/19/17 173 lb (78.5 kg)  07/12/17 175 lb 3.2 oz (79.5 kg)    Physical Exam General - overweight, well-appearing, NAD HEENT - Normocephalic, atraumatic Neck - supple, non-tender, no LAD, no carotid bruit Heart - RRR, no murmurs heard Lungs - Clear throughout all lobes, no wheezing, crackles, or rhonchi. Normal work of breathing. Abdomen - soft, NTND, no masses, no hepatosplenomegaly, active bowel sounds Extremeties - non-tender, no edema, cap refill < 2 seconds, peripheral pulses intact +2 bilaterally Skin - warm, dry Neuro - awake, alert, oriented x3, normal gait Psych - Normal mood and affect, normal behavior   Results for orders placed or performed in visit on 01/12/18  Lipid panel  Result Value Ref Range   Cholesterol 142 <200 mg/dL   HDL 59 >01>50 mg/dL   Triglycerides 027102 <253<150 mg/dL   LDL Cholesterol (Calc) 64 mg/dL (calc)   Total CHOL/HDL Ratio 2.4 <5.0 (calc)  Non-HDL Cholesterol (Calc) 83 <161 mg/dL (calc)  Comprehensive metabolic panel  Result Value Ref Range   Glucose, Bld 105 (H) 65 - 99 mg/dL   BUN 11 7 - 25 mg/dL   Creat 0.96 0.45 - 4.09 mg/dL   BUN/Creatinine Ratio NOT APPLICABLE 6 - 22 (calc)   Sodium 140 135 - 146 mmol/L   Potassium 4.1 3.5 - 5.3 mmol/L   Chloride 104 98 - 110 mmol/L   CO2 29 20 - 32 mmol/L   Calcium 9.4 8.6 - 10.4 mg/dL   Total Protein 6.5 6.1 - 8.1 g/dL   Albumin 4.1 3.6 - 5.1 g/dL   Globulin 2.4 1.9 - 3.7 g/dL (calc)   AG Ratio 1.7 1.0 - 2.5 (calc)   Total Bilirubin 0.5 0.2 - 1.2 mg/dL   Alkaline phosphatase (APISO) 111 33 - 130 U/L   AST  17 10 - 35 U/L   ALT 16 6 - 29 U/L      Assessment & Plan:   Problem List Items Addressed This Visit      Cardiovascular and Mediastinum   Hypertension    Controlled in office today.  Pt reports long-term stable dose of medications.  Follows low salt diet.  Plan: 1. Continue lisinopril 30 mg once daily 2. Continue simvastatin and aspirin to manage hyperlipidemia comorbidity. 3. Labs today - CMP, lipid. 4. Encouraged heart healthy diet and regular exercise. 5. Follow up 6 months.      Relevant Medications   lisinopril (PRINIVIL,ZESTRIL) 30 MG tablet   simvastatin (ZOCOR) 40 MG tablet   Other Relevant Orders   Lipid panel (Completed)   Comprehensive metabolic panel (Completed)     Respiratory   COPD (chronic obstructive pulmonary disease) (HCC)    Well controlled on medication, but still having irritant triggers.  Pt not taking Symbicort as directed.  Continues to remain free of tobacco for last 2 years. Quit date celebration for 2 year anniversary today.  Plan: 1. Refill Symbicort.  Continue taking 1 puffs 2 times daily. Encouraged adherence to BID dosing important for full effect of medication. 2. Refill albuterol.  Use only as rescue inhaler. 3. Repeat PFTs not obtained.  Pt requested to complete.  Refer in future to pulmonology if worsening COPD or needing escalation of medical therapy.  For now is stable. 4. Follow up 6 months or sooner if needed.      Relevant Medications   budesonide-formoterol (SYMBICORT) 160-4.5 MCG/ACT inhaler     Other   Depression    Stable on wellbutrin XL 300 mg once daily.  Denies any regular depression/being down/anhedonia or SI/HI.  Plan: 1. Continue wellbutrin XL 300 mg once daily. 2. Encouraged stress management techniques. 3. Encouraged physical activity. 4. Follow up 6 months or sooner if needed.  Pt verbalizes understanding to contact sooner if persistent symptoms of anhedonia.      Relevant Medications   buPROPion (WELLBUTRIN  XL) 300 MG 24 hr tablet   Hyperlipidemia    Stable by last lab check.  Pt taking simvastatin and monitoring diet.  Plan: 1. Continue simvastatin 40 mg once daily. 2. Encouraged heart healthy diet. 3. Recheck labs today and followup office visit in 6 months.      Relevant Medications   lisinopril (PRINIVIL,ZESTRIL) 30 MG tablet   simvastatin (ZOCOR) 40 MG tablet   Other Relevant Orders   Lipid panel (Completed)   Comprehensive metabolic panel (Completed)      Meds ordered this encounter  Medications  . budesonide-formoterol (  SYMBICORT) 160-4.5 MCG/ACT inhaler    Sig: Inhale 1 puff into the lungs 2 (two) times daily.    Dispense:  1 Inhaler    Refill:  5    Order Specific Question:   Supervising Provider    Answer:   Smitty Cords [[id number]]  . buPROPion (WELLBUTRIN XL) 300 MG 24 hr tablet    Sig: Take 1 tablet (300 mg total) by mouth daily.    Dispense:  90 tablet    Refill:  1    Order Specific Question:   Supervising Provider    Answer:   Smitty Cords [[id number]]  . lisinopril (PRINIVIL,ZESTRIL) 30 MG tablet    Sig: Take 1 tablet (30 mg total) by mouth daily.    Dispense:  90 tablet    Refill:  1    Order Specific Question:   Supervising Provider    Answer:   Smitty Cords [[id number]]  . simvastatin (ZOCOR) 40 MG tablet    Sig: Take 1 tablet (40 mg total) by mouth daily.    Dispense:  90 tablet    Refill:  1    Order Specific Question:   Supervising Provider    Answer:   Smitty Cords [[id number]]     Follow up plan: Return in about 6 months (around 07/12/2018) for Hypertension, Hyperlipidemia, COPD and 3 months for Medicare Wellness with Tiffany.  [name], DNP, AGPCNP-BC Adult Gerontology Primary Care Nurse Practitioner [facility] 02/12/2018, 2:19 PM

## 2018-01-12 NOTE — Patient Instructions (Addendum)
Bonita QuinLinda, Thank you for coming in to clinic today.  1. Continue all medications without changes.  2. Continue your work on improving your diet. - Fruits are great options for desserts. - Increase vegetables for your snacks.  3. Continue working on increased physical activity.  Work toward 30 minutes of physical activity 4-5 days per week.  For now, continue your 30 minutes 3 days per week until it becomes easier.  Then add one day of 15 minutes.  Work up to 30 minutes for your fourth day.  Then, work to add your 5th day.  Please schedule a follow-up appointment with Wilhelmina McardleLauren Shallon Yaklin, AGNP. Return in about 6 months (around 07/12/2018) for Hypertension, Hyperlipidemia, COPD and 3 months for Medicare Wellness with Tiffany.  If you have any other questions or concerns, please feel free to call the clinic or send a message through MyChart. You may also schedule an earlier appointment if necessary.  You will receive a survey after today's visit either digitally by e-mail or paper by [employer] mail. Your experiences and feedback matter to us.  Please respond so we know how we are doing as we provide care for you.   Wilhelmina McardleLauren Jayson Waterhouse, DNP, AGNP-BC Adult Gerontology Nurse Practitioner [facility]

## 2018-01-13 LAB — LIPID PANEL
Cholesterol: 142 mg/dL (ref <200)
HDL: 59 mg/dL (ref >50)
LDL Cholesterol (Calc): 64 mg/dL (calc)
Non-HDL Cholesterol (Calc): 83 mg/dL (calc) (ref <130)
Total CHOL/HDL Ratio: 2.4 (calc) (ref <5.0)
Triglycerides: 102 mg/dL (ref <150)

## 2018-01-13 LAB — COMPREHENSIVE METABOLIC PANEL
AG Ratio: 1.7 (calc) (ref 1.0–2.5)
ALT: 16 U/L (ref 6–29)
AST: 17 U/L (ref 10–35)
Albumin: 4.1 g/dL (ref 3.6–5.1)
Alkaline phosphatase (APISO): 111 U/L (ref 33–130)
BUN: 11 mg/dL (ref 7–25)
CO2: 29 mmol/L (ref 20–32)
Calcium: 9.4 mg/dL (ref 8.6–10.4)
Chloride: 104 mmol/L (ref 98–110)
Creat: 0.71 mg/dL (ref 0.50–0.99)
Globulin: 2.4 g/dL (calc) (ref 1.9–3.7)
Glucose, Bld: 105 mg/dL — ABNORMAL HIGH (ref 65–99)
Potassium: 4.1 mmol/L (ref 3.5–5.3)
Sodium: 140 mmol/L (ref 135–146)
Total Bilirubin: 0.5 mg/dL (ref 0.2–1.2)
Total Protein: 6.5 g/dL (ref 6.1–8.1)

## 2018-02-12 ENCOUNTER — Encounter: Payer: Self-pay | Admitting: Nurse Practitioner

## 2018-02-12 NOTE — Assessment & Plan Note (Signed)
Well controlled on medication, but still having irritant triggers.  Pt not taking Symbicort as directed.  Continues to remain free of tobacco for last 2 years. Quit date celebration for 2 year anniversary today.  Plan: 1. Refill Symbicort.  Continue taking 1 puffs 2 times daily. Encouraged adherence to BID dosing important for full effect of medication. 2. Refill albuterol.  Use only as rescue inhaler. 3. Repeat PFTs not obtained.  Pt requested to complete.  Refer in future to pulmonology if worsening COPD or needing escalation of medical therapy.  For now is stable. 4. Follow up 6 months or sooner if needed.

## 2018-02-12 NOTE — Assessment & Plan Note (Signed)
Controlled in office today.  Pt reports long-term stable dose of medications.  Follows low salt diet.  Plan: 1. Continue lisinopril 30 mg once daily 2. Continue simvastatin and aspirin to manage hyperlipidemia comorbidity. 3. Labs today - CMP, lipid. 4. Encouraged heart healthy diet and regular exercise. 5. Follow up 6 months.

## 2018-02-12 NOTE — Assessment & Plan Note (Signed)
Stable on wellbutrin XL 300 mg once daily.  Denies any regular depression/being down/anhedonia or SI/HI.  Plan: 1. Continue wellbutrin XL 300 mg once daily. 2. Encouraged stress management techniques. 3. Encouraged physical activity. 4. Follow up 6 months or sooner if needed.  Pt verbalizes understanding to contact sooner if persistent symptoms of anhedonia.

## 2018-02-12 NOTE — Assessment & Plan Note (Signed)
Stable by last lab check.  Pt taking simvastatin and monitoring diet.  Plan: 1. Continue simvastatin 40 mg once daily. 2. Encouraged heart healthy diet. 3. Recheck labs today and followup office visit in 6 months.

## 2018-02-28 ENCOUNTER — Encounter: Payer: Self-pay | Admitting: Nurse Practitioner

## 2018-02-28 ENCOUNTER — Ambulatory Visit (INDEPENDENT_AMBULATORY_CARE_PROVIDER_SITE_OTHER): Payer: Medicare Other

## 2018-02-28 ENCOUNTER — Ambulatory Visit (INDEPENDENT_AMBULATORY_CARE_PROVIDER_SITE_OTHER): Payer: Medicare Other | Admitting: Nurse Practitioner

## 2018-02-28 ENCOUNTER — Other Ambulatory Visit: Payer: Self-pay

## 2018-02-28 VITALS — BP 122/66 | HR 95 | Temp 97.6°F | Resp 16 | Ht 63.0 in | Wt 187.6 lb

## 2018-02-28 VITALS — BP 160/98 | HR 84 | Temp 97.6°F | Resp 16 | Ht 63.0 in | Wt 187.6 lb

## 2018-02-28 DIAGNOSIS — Z1159 Encounter for screening for other viral diseases: Secondary | ICD-10-CM

## 2018-02-28 DIAGNOSIS — J209 Acute bronchitis, unspecified: Secondary | ICD-10-CM

## 2018-02-28 DIAGNOSIS — J440 Chronic obstructive pulmonary disease with acute lower respiratory infection: Secondary | ICD-10-CM

## 2018-02-28 DIAGNOSIS — I10 Essential (primary) hypertension: Secondary | ICD-10-CM

## 2018-02-28 DIAGNOSIS — Z0000 Encounter for general adult medical examination without abnormal findings: Secondary | ICD-10-CM | POA: Diagnosis not present

## 2018-02-28 MED ORDER — PREDNISONE 50 MG PO TABS: 50.0000 mg | ORAL_TABLET | Freq: Every day | ORAL | 0 refills | Status: AC

## 2018-02-28 MED ORDER — LISINOPRIL 30 MG PO TABS: 30.0000 mg | ORAL_TABLET | Freq: Every day | ORAL | 1 refills | Status: DC

## 2018-02-28 MED ORDER — BENZONATATE 100 MG PO CAPS: 100.0000 mg | ORAL_CAPSULE | Freq: Three times a day (TID) | ORAL | 0 refills | Status: DC | PRN

## 2018-02-28 NOTE — Patient Instructions (Addendum)
Lindsey Webb , Thank you for taking time to come for your Medicare Wellness Visit. I appreciate your ongoing commitment to your health goals. Please review the following plan we discussed and let me know if I can assist you in the future.   Screening recommendations/referrals: Colonoscopy: due now- declined  Mammogram: completed 08/25/2017 Bone Density: completed 08/25/2017 Recommended yearly ophthalmology/optometry visit for glaucoma screening and checkup Recommended yearly dental visit for hygiene and checkup  Vaccinations: Influenza vaccine: up to date Pneumococcal vaccine: up to date Tdap vaccine: due, check with your insurance company for coverage information Shingles vaccine: eligible, check with your insurance company for coverage  Advanced directives: Advance directive discussed with you today. I have provided a copy for you to complete at home and have notarized. Once this is complete please bring a copy in to our office so we can scan it into your chart.  Conditions/risks identified: Recommend drinking at least 6-8 glasses of water a day   Next appointment: Follow up on 04/12/2018 at 8:00am with Rema Jasmine. Follow up in one year for your annual wellness exam.    Preventive Care 65 Years and Older, Female Preventive care refers to lifestyle choices and visits with your health care provider that can promote health and wellness. What does preventive care include?  A yearly physical exam. This is also called an annual well check.  Dental exams once or twice a year.  Routine eye exams. Ask your health care provider how often you should have your eyes checked.  Personal lifestyle choices, including:  Daily care of your teeth and gums.  Regular physical activity.  Eating a healthy diet.  Avoiding tobacco and drug use.  Limiting alcohol use.  Practicing safe sex.  Taking low-dose aspirin every day.  Taking vitamin and mineral supplements as recommended by your  health care provider. What happens during an annual well check? The services and screenings done by your health care provider during your annual well check will depend on your age, overall health, lifestyle risk factors, and family history of disease. Counseling  Your health care provider may ask you questions about your:  Alcohol use.  Tobacco use.  Drug use.  Emotional well-being.  Home and relationship well-being.  Sexual activity.  Eating habits.  History of falls.  Memory and ability to understand (cognition).  Work and work Astronomer.  Reproductive health. Screening  You may have the following tests or measurements:  Height, weight, and BMI.  Blood pressure.  Lipid and cholesterol levels. These may be checked every 5 years, or more frequently if you are over 22 years old.  Skin check.  Lung cancer screening. You may have this screening every year starting at age 32 if you have a 30-pack-year history of smoking and currently smoke or have quit within the past 15 years.  Fecal occult blood test (FOBT) of the stool. You may have this test every year starting at age 53.  Flexible sigmoidoscopy or colonoscopy. You may have a sigmoidoscopy every 5 years or a colonoscopy every 10 years starting at age 58.  Hepatitis C blood test.  Hepatitis B blood test.  Sexually transmitted disease (STD) testing.  Diabetes screening. This is done by checking your blood sugar (glucose) after you have not eaten for a while (fasting). You may have this done every 1-3 years.  Bone density scan. This is done to screen for osteoporosis. You may have this done starting at age 67.  Mammogram. This may be done every 1-2  years. Talk to your health care provider about how often you should have regular mammograms. Talk with your health care provider about your test results, treatment options, and if necessary, the need for more tests. Vaccines  Your health care provider may recommend  certain vaccines, such as:  Influenza vaccine. This is recommended every year.  Tetanus, diphtheria, and acellular pertussis (Tdap, Td) vaccine. You may need a Td booster every 10 years.  Zoster vaccine. You may need this after age 59.  Pneumococcal 13-valent conjugate (PCV13) vaccine. One dose is recommended after age 12.  Pneumococcal polysaccharide (PPSV23) vaccine. One dose is recommended after age 54. Talk to your health care provider about which screenings and vaccines you need and how often you need them. This information is not intended to replace advice given to you by your health care provider. Make sure you discuss any questions you have with your health care provider. Document Released: 01/02/2016 Document Revised: 08/25/2016 Document Reviewed: 10/07/2015 Elsevier Interactive Patient Education  2017 [employer] Prevention in the Home Falls can cause injuries. They can happen to people of all ages. There are many things you can do to make your home safe and to help prevent falls. What can I do on the outside of my home?  Regularly fix the edges of walkways and driveways and fix any cracks.  Remove anything that might make you trip as you walk through a door, such as a raised step or threshold.  Trim any bushes or trees on the path to your home.  Use bright outdoor lighting.  Clear any walking paths of anything that might make someone trip, such as rocks or tools.  Regularly check to see if handrails are loose or broken. Make sure that both sides of any steps have handrails.  Any raised decks and porches should have guardrails on the edges.  Have any leaves, snow, or ice cleared regularly.  Use sand or salt on walking paths during winter.  Clean up any spills in your garage right away. This includes oil or grease spills. What can I do in the bathroom?  Use night lights.  Install grab bars by the toilet and in the tub and shower. Do not use towel bars as  grab bars.  Use non-skid mats or decals in the tub or shower.  If you need to sit down in the shower, use a plastic, non-slip stool.  Keep the floor dry. Clean up any water that spills on the floor as soon as it happens.  Remove soap buildup in the tub or shower regularly.  Attach bath mats securely with double-sided non-slip rug tape.  Do not have throw rugs and other things on the floor that can make you trip. What can I do in the bedroom?  Use night lights.  Make sure that you have a light by your bed that is easy to reach.  Do not use any sheets or blankets that are too big for your bed. They should not hang down onto the floor.  Have a firm chair that has side arms. You can use this for support while you get dressed.  Do not have throw rugs and other things on the floor that can make you trip. What can I do in the kitchen?  Clean up any spills right away.  Avoid walking on wet floors.  Keep items that you use a lot in easy-to-reach places.  If you need to reach something above you, use a strong step  stool that has a grab bar.  Keep electrical cords out of the way.  Do not use floor polish or wax that makes floors slippery. If you must use wax, use non-skid floor wax.  Do not have throw rugs and other things on the floor that can make you trip. What can I do with my stairs?  Do not leave any items on the stairs.  Make sure that there are handrails on both sides of the stairs and use them. Fix handrails that are broken or loose. Make sure that handrails are as long as the stairways.  Check any carpeting to make sure that it is firmly attached to the stairs. Fix any carpet that is loose or worn.  Avoid having throw rugs at the top or bottom of the stairs. If you do have throw rugs, attach them to the floor with carpet tape.  Make sure that you have a light switch at the top of the stairs and the bottom of the stairs. If you do not have them, ask someone to add them  for you. What else can I do to help prevent falls?  Wear shoes that:  Do not have high heels.  Have rubber bottoms.  Are comfortable and fit you well.  Are closed at the toe. Do not wear sandals.  If you use a stepladder:  Make sure that it is fully opened. Do not climb a closed stepladder.  Make sure that both sides of the stepladder are locked into place.  Ask someone to hold it for you, if possible.  Clearly mark and make sure that you can see:  Any grab bars or handrails.  First and last steps.  Where the edge of each step is.  Use tools that help you move around (mobility aids) if they are needed. These include:  Canes.  Walkers.  Scooters.  Crutches.  Turn on the lights when you go into a dark area. Replace any light bulbs as soon as they burn out.  Set up your furniture so you have a clear path. Avoid moving your furniture around.  If any of your floors are uneven, fix them.  If there are any pets around you, be aware of where they are.  Review your medicines with your doctor. Some medicines can make you feel dizzy. This can increase your chance of falling. Ask your doctor what other things that you can do to help prevent falls. This information is not intended to replace advice given to you by your health care provider. Make sure you discuss any questions you have with your health care provider. Document Released: 10/02/2009 Document Revised: 05/13/2016 Document Reviewed: 01/10/2015 Elsevier Interactive Patient Education  2017 [employer]  Your provider would like to you have your annual eye exam. Please contact your current eye doctor or here are some good options for you to contact.   [facility]        [facility] Address: [address], [place], [place] [address]   Address: [address], [place], [place] [address]  Phone: [phone]      Phone: [phone]  Website: [email or web address]    Website: [email or web address]       [facility]       [facility]  Address: [address], [place], [place] [address]   Address: [address], [place], [place] [address] Phone: [phone]      Phone: [phone]    [facility]  Address: [address] [place], [place], [place] [address]  Phone: [phone]

## 2018-02-28 NOTE — Progress Notes (Signed)
Subjective:    Patient ID: Lindsey Webb, female    DOB: 08/28/1946, 72 y.o.   MRN: [id number]  Lindsey Webb is a 72 y.o. female presenting on 02/28/2018 for Cough (chest congestion, productive cough, bodyaches and fatigue. Pt admits been exposed to the flu)   HPI URI Pt presents to clinic 5 days after onset of URI symptoms.  Symptoms include coughing with congested but non-productive cough, sneezing, head congestion, fatigue/malaise, diarrhea. Mild fever, but no chills/sweats.  No nausea, vomiting. - Cough has worsened since initial symptoms and is preventing rest. - Daughter-in-law and grandson had flu. - Has been taking Tylenol cold and flu for symptoms.  Has not helped much.  Social History   Tobacco Use  . Smoking status: Former Smoker    Years: 45.00    Types: Cigarettes    Last attempt to quit: 01/13/2016    Years since quitting: 2.1  . Smokeless tobacco: Never Used  Substance Use Topics  . Alcohol use: No  . Drug use: No    Review of Systems Per HPI unless specifically indicated above     Objective:    BP 122/66 (BP Location: Right Arm, Patient Position: Sitting, Cuff Size: Large)   Pulse 95   Temp 97.6 F (36.4 C) (Oral)   Resp 16   Ht 5\' 3"  (1.6 m)   Wt 187 lb 9.6 oz (85.1 kg)   SpO2 96%   BMI 33.23 kg/m   Wt Readings from Last 3 Encounters:  02/28/18 187 lb 9.6 oz (85.1 kg)  02/28/18 187 lb 9.6 oz (85.1 kg)  01/12/18 186 lb (84.4 kg)    Physical Exam  Constitutional: She is oriented to person, place, and time. She appears well-developed and well-nourished. She appears distressed (mildly).  HENT:  Head: Normocephalic and atraumatic.  Right Ear: Hearing, tympanic membrane, external ear and ear canal normal.  Left Ear: Hearing, tympanic membrane, external ear and ear canal normal.  Nose: Mucosal edema and rhinorrhea present. Right sinus exhibits maxillary sinus tenderness and frontal sinus tenderness. Left sinus exhibits maxillary sinus tenderness and  frontal sinus tenderness.  Mouth/Throat: Uvula is midline and mucous membranes are normal. Posterior oropharyngeal erythema present. No oropharyngeal exudate (clear secretions), posterior oropharyngeal edema or tonsillar abscesses.  Neck: Normal range of motion. Neck supple.  Cardiovascular: Normal rate, regular rhythm, S1 normal, S2 normal, normal heart sounds and intact distal pulses.  Pulmonary/Chest: Effort normal. No accessory muscle usage. No tachypnea. No respiratory distress. She has decreased breath sounds. She has wheezes. She has rhonchi. She has rales.  Abdominal: Soft. Normal appearance. Bowel sounds are increased. There is no tenderness.  Lymphadenopathy:    She has cervical adenopathy.  Neurological: She is alert and oriented to person, place, and time.  Skin: Skin is warm and dry.  Psychiatric: She has a normal mood and affect. She is withdrawn.  Vitals reviewed.    Results for orders placed or performed in visit on 01/12/18  Lipid panel  Result Value Ref Range   Cholesterol 142 <200 mg/dL   HDL 59 >40 mg/dL   Triglycerides 981 <191 mg/dL   LDL Cholesterol (Calc) 64 mg/dL (calc)   Total CHOL/HDL Ratio 2.4 <5.0 (calc)   Non-HDL Cholesterol (Calc) 83 <478 mg/dL (calc)  Comprehensive metabolic panel  Result Value Ref Range   Glucose, Bld 105 (H) 65 - 99 mg/dL   BUN 11 7 - 25 mg/dL   Creat 2.95 6.21 - 3.08 mg/dL   BUN/Creatinine Ratio NOT APPLICABLE  6 - 22 (calc)   Sodium 140 135 - 146 mmol/L   Potassium 4.1 3.5 - 5.3 mmol/L   Chloride 104 98 - 110 mmol/L   CO2 29 20 - 32 mmol/L   Calcium 9.4 8.6 - 10.4 mg/dL   Total Protein 6.5 6.1 - 8.1 g/dL   Albumin 4.1 3.6 - 5.1 g/dL   Globulin 2.4 1.9 - 3.7 g/dL (calc)   AG Ratio 1.7 1.0 - 2.5 (calc)   Total Bilirubin 0.5 0.2 - 1.2 mg/dL   Alkaline phosphatase (APISO) 111 33 - 130 U/L   AST 17 10 - 35 U/L   ALT 16 6 - 29 U/L      Assessment & Plan:   Problem List Items Addressed This Visit      Cardiovascular and  Mediastinum   Hypertension Well controlled in clinic today on medications despite illness.  Pt requests refill of lisinopril.  Refill provided.  Followup as scheduled.   Relevant Medications   lisinopril (PRINIVIL,ZESTRIL) 30 MG tablet    Other Visit Diagnoses    Acute bronchitis with COPD (HCC)    -  Primary Consistent with mild acute exacerbation of COPD with worsening productive cough after flu-like illness in pt who received flu vaccine this year.  Increased sinus pressure with congestion could indicate possible sinusitis, but duration of symptoms is < 5 days.   - No hypoxia (96% on RA), afebrile, no recent hospitalization - Continues Albuterol, Symbicort  Plan: 1. Start Prednisone 50mg  x 5 day steroid burst 2. Use albuterol q 6-8 hr regularly x 2-3 days. Continue maintenance inhalers 3. Considered antibiotics, however afebrile and no hypoxia, would not be indicated in mild AECOPD, unless recurrent or not improved on steroids.  Could consider antibiotic treatment of sinusitis if sinus congestion worsens and infection is persistent. 4. RTC about 1 week if not improving, otherwise strict return criteria to go to ED.    Relevant Medications   guaiFENesin-dextromethorphan (ROBITUSSIN DM) 100-10 MG/5ML syrup   Pseudoephedrine-APAP-DM (TYLENOL COLD/FLU SEVERE DAY PO)   benzonatate (TESSALON PERLES) 100 MG capsule   predniSONE (DELTASONE) 50 MG tablet      Meds ordered this encounter  Medications  . benzonatate (TESSALON PERLES) 100 MG capsule    Sig: Take 1 capsule (100 mg total) by mouth 3 (three) times daily as needed for cough.    Dispense:  30 capsule    Refill:  0    Order Specific Question:   Supervising Provider    Answer:   Smitty CordsKARAMALEGOS, ALEXANDER J [[id number]]  . predniSONE (DELTASONE) 50 MG tablet    Sig: Take 1 tablet (50 mg total) by mouth daily with breakfast for 5 days.    Dispense:  5 tablet    Refill:  0    Order Specific Question:   Supervising Provider    Answer:    Smitty CordsKARAMALEGOS, ALEXANDER J [[id number]]  . lisinopril (PRINIVIL,ZESTRIL) 30 MG tablet    Sig: Take 1 tablet (30 mg total) by mouth daily.    Dispense:  90 tablet    Refill:  1    Order Specific Question:   Supervising Provider    Answer:   Smitty CordsKARAMALEGOS, ALEXANDER J [[id number]]      Follow up plan: Return if symptoms worsen or fail to improve.   Wilhelmina McardleLauren Davidmichael Zarazua, DNP, AGPCNP-BC Adult Gerontology Primary Care Nurse Practitioner [facility] [place] [facility] 02/28/2018, 9:04 AM

## 2018-02-28 NOTE — Progress Notes (Signed)
Subjective:   Lindsey Webb is a 72 y.o. female who presents for Medicare Annual (Subsequent) preventive examination.  Review of Systems:   Cardiac Risk Factors include: advanced age (>24men, >20 women);hypertension;obesity (BMI >30kg/m2);dyslipidemia;smoking/ tobacco exposure     Objective:     Vitals: BP (!) 160/98 (BP Location: Left Arm, Patient Position: Sitting)   Pulse 84   Temp 97.6 F (36.4 C) (Oral)   Resp 16   Ht 5\' 3"  (1.6 m)   Wt 187 lb 9.6 oz (85.1 kg)   BMI 33.23 kg/m   Body mass index is 33.23 kg/m.  Advanced Directives 02/28/2018  Does Patient Have a Medical Advance Directive? No  Would patient like information on creating a medical advance directive? Yes (MAU/Ambulatory/Procedural Areas - Information given)    Tobacco Social History   Tobacco Use  Smoking Status Former Smoker  . Years: 45.00  . Types: Cigarettes  . Last attempt to quit: 01/13/2016  . Years since quitting: 2.1  Smokeless Tobacco Never Used     Counseling given: Not Answered   Clinical Intake:  Pre-visit preparation completed: Yes  Pain : No/denies pain     Nutritional Status: BMI > 30  Obese Nutritional Risks: None Diabetes: No  How often do you need to have someone help you when you read instructions, pamphlets, or other written materials from your doctor or pharmacy?: 1 - Never What is the last grade level you completed in school?: GED  Interpreter Needed?: No  Information entered by :: Lindsey Hill,LPN   Past Medical History:  Diagnosis Date  . Anxiety   . Arthritis    osteoarthritis  . COPD (chronic obstructive pulmonary disease) (HCC)   . Depression   . History of cervical cancer   . Hyperlipidemia   . Hypertension   . Osteopenia   . Sleep apnea   . Vitamin D deficiency    Past Surgical History:  Procedure Laterality Date  . ABDOMINAL HYSTERECTOMY  1982  . CHOLECYSTECTOMY  1981  . TONSILLECTOMY  1962   Family History  Problem Relation Age of Onset    . Prostate cancer Father   . Brain cancer Father   . Colon polyps Father   . Brain cancer Mother   . Brain cancer Paternal Grandmother    Social History   Socioeconomic History  . Marital status: Married    Spouse name: None  . Number of children: None  . Years of education: None  . Highest education level: None  Social Needs  . Financial resource strain: Not hard at all  . Food insecurity - worry: Never true  . Food insecurity - inability: Never true  . Transportation needs - medical: No  . Transportation needs - non-medical: No  Occupational History  . None  Tobacco Use  . Smoking status: Former Smoker    Years: 45.00    Types: Cigarettes    Last attempt to quit: 01/13/2016    Years since quitting: 2.1  . Smokeless tobacco: Never Used  Substance and Sexual Activity  . Alcohol use: No  . Drug use: No  . Sexual activity: Yes    Birth control/protection: Post-menopausal, Surgical  Other Topics Concern  . None  Social History Narrative  . None    Outpatient Encounter Medications as of 02/28/2018  Medication Sig  . albuterol (PROVENTIL HFA;VENTOLIN HFA) 108 (90 Base) MCG/ACT inhaler Inhale 1 puff into the lungs every 6 (six) hours as needed for wheezing or shortness of breath.  Marland Kitchen  aspirin EC 81 MG tablet Take 81 mg by mouth daily.  . budesonide-formoterol (SYMBICORT) 160-4.5 MCG/ACT inhaler Inhale 1 puff into the lungs 2 (two) times daily.  Marland Kitchen. buPROPion (WELLBUTRIN XL) 300 MG 24 hr tablet Take 1 tablet (300 mg total) by mouth daily.  . calcium carbonate (CALCIUM 600) 600 MG TABS tablet Take 600 mg by mouth 2 (two) times daily with a meal.  . cholecalciferol (VITAMIN D) 400 units TABS tablet Take 5,000 Units by mouth daily.  Marland Kitchen. lisinopril (PRINIVIL,ZESTRIL) 30 MG tablet Take 1 tablet (30 mg total) by mouth daily.  . simvastatin (ZOCOR) 40 MG tablet Take 1 tablet (40 mg total) by mouth daily.   No facility-administered encounter medications on file as of 02/28/2018.      Activities of Daily Living In your present state of health, do you have any difficulty performing the following activities: 02/28/2018 01/12/2018  Hearing? N N  Vision? Y Y  Difficulty concentrating or making decisions? N N  Walking or climbing stairs? N N  Dressing or bathing? N N  Doing errands, shopping? N N  Preparing Food and eating ? N -  Using the Toilet? N -  In the past six months, have you accidently leaked urine? Y -  Comment with coughing -  Do you have problems with loss of bowel control? N -  Managing your Medications? N -  Managing your Finances? N -  Housekeeping or managing your Housekeeping? N -  Some recent data might be hidden    Patient Care Team: Galen ManilaKennedy, Lindsey Renee, NP as PCP - General (Nurse Practitioner)    Assessment:   This is a routine wellness examination for [name].  Exercise Activities and Dietary recommendations Current Exercise Habits: Home exercise routine, Type of exercise: strength training/weights, Time (Minutes): 45, Frequency (Times/Week): 4, Weekly Exercise (Minutes/Week): 180, Intensity: Mild  Goals    . DIET - INCREASE WATER INTAKE      recommend drinking at least 6-8 glasses of water a day        Fall Risk Fall Risk  02/28/2018 09/19/2017  Falls in the past year? No No  Risk for fall due to : - History of fall(s)   Is the patient's home free of loose throw rugs in walkways, pet beds, electrical cords, etc?   yes      Grab bars in the bathroom? yes      Handrails on the stairs?   yes      Adequate lighting?   yes  Timed Get Up and Go performed: Completed in 9 seconds with no use of assistive devices, steady gait. No intervention needed at this time.   Depression Screen PHQ 2/9 Scores 02/28/2018 07/12/2017  PHQ - 2 Score 0 2  PHQ- 9 Score - 11     Cognitive Function     6CIT Screen 02/28/2018  What Year? 0 points  What month? 0 points  What time? 0 points  Count back from 20 0 points  Months in reverse 0 points   Repeat phrase 0 points  Total Score 0    Immunization History  Administered Date(s) Administered  . Influenza-Unspecified 09/20/2015, 10/01/2017  . Pneumococcal Conjugate-13 03/13/2014  . Pneumococcal-Unspecified 02/20/2013    Qualifies for Shingles Vaccine? Yes, discussed shingrix vaccine  Screening Tests Health Maintenance  Topic Date Due  . Hepatitis C Screening  10-07-1946  . TETANUS/TDAP  02/07/1967  . COLONOSCOPY  12/21/2011  . MAMMOGRAM  02/18/2019  . INFLUENZA VACCINE  Completed  .  DEXA SCAN  Completed  . PNA vac Low Risk Adult  Completed    Cancer Screenings: Lung: Low Dose CT Chest recommended if Age 43-80 years, 30 pack-year currently smoking OR have quit w/in 15years. Patient does qualify. Will send Shawn Perkins,RN- Oncology Navigator a message to see if patient is eligible.  Breast:  Up to date on Mammogram? Yes   Up to date of Bone Density/Dexa? Yes Colorectal: due - declined  Additional Screenings:  Hepatitis B/HIV/Syphillis:not indicated Hepatitis C Screening: will draw at next lab draw     Plan:    I have personally reviewed and addressed the Medicare Annual Wellness questionnaire and have noted the following in the patient's chart:  A. Medical and social history B. Use of alcohol, tobacco or illicit drugs  C. Current medications and supplements D. Functional ability and status E.  Nutritional status F.  Physical activity G. Advance directives H. List of other physicians I.  Hospitalizations, surgeries, and ER visits in previous 12 months J.  Vitals K. Screenings such as hearing and vision if needed, cognitive and depression L. Referrals and appointments   In addition, I have reviewed and discussed with patient certain preventive protocols, quality metrics, and best practice recommendations. A written personalized care plan for preventive services as well as general preventive health recommendations were provided to patient.   Signed,   Marin Roberts, LPN Nurse Health Advisor   Nurse Notes: patient needs refill on lisinopril.  BP 160/98 rechecked at 122/68, patient complaining of a cough x 1 week. Patient worked in to be seen by Rema Jasmine today.

## 2018-02-28 NOTE — Patient Instructions (Addendum)
Bonita QuinLinda, Thank you for coming in to clinic today.  1. It sounds like you have a Upper Respiratory Virus - this will most likely run it's course in 7 to 10 days. Recommend good hand washing. - Start Atrovent nasal spray decongestant 2 sprays each nostril up to 4 times daily for 5-7 days - Can use Flonase 2 sprays each nostril daily for up to 4-6 weeks - Start OTC Mucinex (or may try Mucinex-DM for cough) up to 7-10 days then stop - Drink plenty of fluids to improve congestion - You may try over the counter Nasal Saline spray (Simply Saline, Ocean Spray) as needed to reduce congestion. - Drink warm herbal tea with honey for sore throat. - Start taking Tylenol extra strength 1 to 2 tablets every 6-8 hours for aches or fever/chills for next few days as needed.  Do not take more than 3,000 mg in 24 hours from all medicines.  May take Ibuprofen as well if tolerated 200-400mg  every 8 hours as needed.  - USE albuterol 1-2 puffs every 6-8 hours for the next 2 days. - START benzonatate 100 mg capsule.  Take one three times daily as needed for cough. - START prednisone 50 mg tablet.  Take 1 tablet once daily for 5 days.  If symptoms significantly worsening with persistent fevers/chills despite tylenol/ibpurofen, nausea, vomiting unable to tolerate food/fluids or medicine, body aches, or shortness of breath, sinus pain pressure or worsening productive cough, then follow-up for re-evaluation, may seek more immediate care at Urgent Care or ED if more concerned for emergency.  Please schedule a follow-up appointment with Wilhelmina McardleLauren Petrina Melby, AGNP. Return if symptoms worsen or fail to improve.  If you have any other questions or concerns, please feel free to call the clinic or send a message through MyChart. You may also schedule an earlier appointment if necessary.  You will receive a survey after today's visit either digitally by e-mail or paper by [employer] mail. Your experiences and feedback matter to us.  Please  respond so we know how we are doing as we provide care for you.   Wilhelmina McardleLauren Sidda Humm, DNP, AGNP-BC Adult Gerontology Nurse Practitioner [facility], [facility]

## 2018-03-01 ENCOUNTER — Telehealth: Payer: Self-pay | Admitting: *Deleted

## 2018-03-01 DIAGNOSIS — Z122 Encounter for screening for malignant neoplasm of respiratory organs: Secondary | ICD-10-CM

## 2018-03-01 DIAGNOSIS — Z87891 Personal history of nicotine dependence: Secondary | ICD-10-CM

## 2018-03-01 NOTE — Telephone Encounter (Signed)
Received referral for initial lung cancer screening scan. Contacted patient and obtained smoking history,(former, quit 2017, 45 pack year) as well as answering questions related to screening process. Patient denies signs of lung cancer such as weight loss or hemoptysis. Patient denies comorbidity that would prevent curative treatment if lung cancer were found. Patient is scheduled for shared decision making visit and CT scan on 03/21/18.

## 2018-03-21 ENCOUNTER — Ambulatory Visit
Admission: RE | Admit: 2018-03-21 | Discharge: 2018-03-21 | Disposition: A | Discharge status: Home / Self Care | Payer: Medicare Other | Source: Ambulatory Visit | Attending: Oncology | Admitting: Oncology

## 2018-03-21 ENCOUNTER — Encounter: Payer: Self-pay | Admitting: Oncology

## 2018-03-21 ENCOUNTER — Inpatient Hospital Stay: Payer: Medicare Other | Attending: Oncology | Admitting: Oncology

## 2018-03-21 DIAGNOSIS — Z122 Encounter for screening for malignant neoplasm of respiratory organs: Secondary | ICD-10-CM | POA: Insufficient documentation

## 2018-03-21 DIAGNOSIS — R918 Other nonspecific abnormal finding of lung field: Secondary | ICD-10-CM | POA: Diagnosis not present

## 2018-03-21 DIAGNOSIS — I700 Atherosclerosis of aorta: Secondary | ICD-10-CM | POA: Insufficient documentation

## 2018-03-21 DIAGNOSIS — I2510 Atherosclerotic heart disease of native coronary artery without angina pectoris: Secondary | ICD-10-CM | POA: Insufficient documentation

## 2018-03-21 DIAGNOSIS — Z87891 Personal history of nicotine dependence: Secondary | ICD-10-CM | POA: Diagnosis not present

## 2018-03-21 DIAGNOSIS — J438 Other emphysema: Secondary | ICD-10-CM | POA: Insufficient documentation

## 2018-03-21 DIAGNOSIS — J432 Centrilobular emphysema: Secondary | ICD-10-CM | POA: Diagnosis not present

## 2018-03-21 NOTE — Progress Notes (Signed)
In accordance with CMS guidelines, patient has met eligibility criteria including age, absence of signs or symptoms of lung cancer.  Social History   Tobacco Use  . Smoking status: Former Smoker    Packs/day: 1.00    Years: 45.00    Pack years: 45.00    Types: Cigarettes    Last attempt to quit: 01/13/2016    Years since quitting: 2.1  . Smokeless tobacco: Never Used  Substance Use Topics  . Alcohol use: No  . Drug use: No     A shared decision-making session was conducted prior to the performance of CT scan. This includes one or more decision aids, includes benefits and harms of screening, follow-up diagnostic testing, over-diagnosis, false positive rate, and total radiation exposure.  Counseling on the importance of adherence to annual lung cancer LDCT screening, impact of co-morbidities, and ability or willingness to undergo diagnosis and treatment is imperative for compliance of the program.  Counseling on the importance of continued smoking cessation for former smokers; the importance of smoking cessation for current smokers, and information about tobacco cessation interventions have been given to patient including [place] and 1800 quit  programs.  Written order for lung cancer screening with LDCT has been given to the patient and any and all questions have been answered to the best of my abilities.   Yearly follow up will be coordinated by Burgess Estelle, Thoracic Navigator.  Faythe Casa, NP 03/21/2018 2:24 PM

## 2018-03-24 ENCOUNTER — Encounter: Payer: Self-pay | Admitting: Nurse Practitioner

## 2018-03-24 ENCOUNTER — Encounter: Payer: Self-pay | Admitting: *Deleted

## 2018-03-24 DIAGNOSIS — I2510 Atherosclerotic heart disease of native coronary artery without angina pectoris: Secondary | ICD-10-CM | POA: Insufficient documentation

## 2018-07-12 ENCOUNTER — Ambulatory Visit: Payer: Medicare Other | Admitting: Nurse Practitioner

## 2018-09-29 ENCOUNTER — Other Ambulatory Visit: Payer: Self-pay | Admitting: Nurse Practitioner

## 2018-09-29 DIAGNOSIS — E785 Hyperlipidemia, unspecified: Secondary | ICD-10-CM

## 2018-09-29 DIAGNOSIS — F324 Major depressive disorder, single episode, in partial remission: Secondary | ICD-10-CM

## 2018-10-10 ENCOUNTER — Other Ambulatory Visit: Payer: Self-pay | Admitting: Nurse Practitioner

## 2018-10-10 DIAGNOSIS — E785 Hyperlipidemia, unspecified: Secondary | ICD-10-CM

## 2018-10-17 ENCOUNTER — Ambulatory Visit: Payer: Medicare Other | Admitting: Nurse Practitioner

## 2018-10-17 ENCOUNTER — Encounter: Payer: Self-pay | Admitting: Nurse Practitioner

## 2018-10-17 ENCOUNTER — Other Ambulatory Visit: Payer: Self-pay

## 2018-10-17 VITALS — BP 142/67 | HR 90 | Temp 97.9°F | Resp 18 | Ht 63.0 in | Wt 194.2 lb

## 2018-10-17 DIAGNOSIS — J0140 Acute pansinusitis, unspecified: Secondary | ICD-10-CM

## 2018-10-17 DIAGNOSIS — R05 Cough: Secondary | ICD-10-CM | POA: Diagnosis not present

## 2018-10-17 DIAGNOSIS — R059 Cough, unspecified: Secondary | ICD-10-CM

## 2018-10-17 MED ORDER — AMOXICILLIN-POT CLAVULANATE 875-125 MG PO TABS: 1.0000 | ORAL_TABLET | Freq: Two times a day (BID) | ORAL | 0 refills | Status: AC

## 2018-10-17 MED ORDER — HYDROCOD POLST-CPM POLST ER 10-8 MG/5ML PO SUER: 5.0000 mL | Freq: Every evening | ORAL | 0 refills | Status: AC | PRN

## 2018-10-17 MED ORDER — IPRATROPIUM BROMIDE 0.06 % NA SOLN: 2.0000 | Freq: Four times a day (QID) | NASAL | 0 refills | Status: DC

## 2018-10-17 NOTE — Progress Notes (Signed)
Subjective:    Patient ID: Lindsey Webb, female    DOB: 1946/11/14, 72 y.o.   MRN: [id number]  Lindsey Webb is a 72 y.o. female presenting on 10/17/2018 for Cough (post nasal drainge, cough worse at bedtime and sinus pressure x 1 days )   HPI Cough  Has been sick with URI about 2 weeks ago, now has ongoing cough, drainage, PND and is worse at bedtime.  Increased sinus pressure x 1 day. - started Thursday 2 weeks ago, then had nausea/vomiting on Saturday,  Then had fever last Wednesday.   - Robitussin is not helping. - Not resting due to cough.  Social History   Tobacco Use  . Smoking status: Former Smoker    Packs/day: 1.00    Years: 45.00    Pack years: 45.00    Types: Cigarettes    Last attempt to quit: 01/13/2016    Years since quitting: 2.7  . Smokeless tobacco: Never Used  Substance Use Topics  . Alcohol use: No  . Drug use: No    Review of Systems Per HPI unless specifically indicated above     Objective:    BP (!) 142/67   Pulse 90   Temp 97.9 F (36.6 C) (Oral)   Resp 18   Ht 5\' 3"  (1.6 m)   Wt 194 lb 3.2 oz (88.1 kg)   SpO2 98%   BMI 34.40 kg/m   Wt Readings from Last 3 Encounters:  10/17/18 194 lb 3.2 oz (88.1 kg)  03/21/18 187 lb (84.8 kg)  02/28/18 187 lb 9.6 oz (85.1 kg)    Physical Exam  Constitutional: She appears well-developed and well-nourished.  HENT:  Head: Normocephalic and atraumatic.  Right Ear: Hearing, tympanic membrane, external ear and ear canal normal.  Left Ear: Hearing, tympanic membrane, external ear and ear canal normal.  Nose: Mucosal edema and rhinorrhea present. Right sinus exhibits maxillary sinus tenderness and frontal sinus tenderness. Left sinus exhibits maxillary sinus tenderness and frontal sinus tenderness.  Mouth/Throat: Uvula is midline and mucous membranes are normal. Posterior oropharyngeal edema (cobblestoning) and posterior oropharyngeal erythema (mildly injected) present. Oropharyngeal exudate: clear secretions.  Tonsils are 0 on the right. Tonsils are 0 on the left.  Eyes: Pupils are equal, round, and reactive to light. Conjunctivae, EOM and lids are normal. Right eye exhibits no discharge. Left eye exhibits no discharge.  Neck: Normal range of motion. Neck supple.  Cardiovascular: Normal rate, regular rhythm, S1 normal, S2 normal, normal heart sounds and intact distal pulses.  Pulmonary/Chest: Effort normal and breath sounds normal. No respiratory distress. She has no wheezes. She has no rhonchi. She has no rales.  Lymphadenopathy:    She has cervical adenopathy.  Neurological: She is alert.  Skin: Skin is warm and dry. Capillary refill takes less than 2 seconds.  Psychiatric: She has a normal mood and affect. Her behavior is normal.  Vitals reviewed.  Results for orders placed or performed in visit on 01/12/18  Lipid panel  Result Value Ref Range   Cholesterol 142 <200 mg/dL   HDL 59 >40 mg/dL   Triglycerides 981 <191 mg/dL   LDL Cholesterol (Calc) 64 mg/dL (calc)   Total CHOL/HDL Ratio 2.4 <5.0 (calc)   Non-HDL Cholesterol (Calc) 83 <478 mg/dL (calc)  Comprehensive metabolic panel  Result Value Ref Range   Glucose, Bld 105 (H) 65 - 99 mg/dL   BUN 11 7 - 25 mg/dL   Creat 2.95 6.21 - 3.08 mg/dL   BUN/Creatinine Ratio  NOT APPLICABLE 6 - 22 (calc)   Sodium 140 135 - 146 mmol/L   Potassium 4.1 3.5 - 5.3 mmol/L   Chloride 104 98 - 110 mmol/L   CO2 29 20 - 32 mmol/L   Calcium 9.4 8.6 - 10.4 mg/dL   Total Protein 6.5 6.1 - 8.1 g/dL   Albumin 4.1 3.6 - 5.1 g/dL   Globulin 2.4 1.9 - 3.7 g/dL (calc)   AG Ratio 1.7 1.0 - 2.5 (calc)   Total Bilirubin 0.5 0.2 - 1.2 mg/dL   Alkaline phosphatase (APISO) 111 33 - 130 U/L   AST 17 10 - 35 U/L   ALT 16 6 - 29 U/L      Assessment & Plan:   Problem List Items Addressed This Visit    None    Visit Diagnoses    Acute non-recurrent pansinusitis    -  Primary   Relevant Medications   guaiFENesin (MUCINEX) 600 MG 12 hr tablet    amoxicillin-clavulanate (AUGMENTIN) 875-125 MG tablet   ipratropium (ATROVENT) 0.06 % nasal spray   chlorpheniramine-HYDROcodone (TUSSIONEX PENNKINETIC ER) 10-8 MG/5ML SUER   Cough       Relevant Medications   chlorpheniramine-HYDROcodone (TUSSIONEX PENNKINETIC ER) 10-8 MG/5ML SUER    Consistent with URI and secondary sinusitis with symptoms worsening over the past 7 days and initial symptoms of nasal congestion and sinus pressure over 12 days ago.   Plan: 1.START taking augmentin 875-125mg  tablets every 12 hours for 10 days.  Discussed completing antibiotic. - While on antibiotic, take a probiotic OTC or from food. - Start Atrovent nasal spray decongestant 2 sprays each nostril up to 4 times daily for 5-7 days - Continue anti-histamine loratadine 10mg  daily. - Can use Flonase 2 sprays each nostril daily for up to 4-6 weeks if no epistaxis. - Start Mucinex-DM OTC for  7-10 days prn congestion - provided Tussionex for 5 nights.  Take 5 mL at bedtime as needed. 2. Supportive care with nasal saline, warm herbal tea with honey, 3. Improve hydration 4. Tylenol / Motrin PRN fevers  5. Return criteria given    Meds ordered this encounter  Medications  . amoxicillin-clavulanate (AUGMENTIN) 875-125 MG tablet    Sig: Take 1 tablet by mouth 2 (two) times daily for 10 days.    Dispense:  20 tablet    Refill:  0    Order Specific Question:   Supervising Provider    Answer:   Smitty Cords [[id number]]  . ipratropium (ATROVENT) 0.06 % nasal spray    Sig: Place 2 sprays into both nostrils 4 (four) times daily for 5 days.    Dispense:  15 mL    Refill:  0    Order Specific Question:   Supervising Provider    Answer:   Smitty Cords [[id number]]  . chlorpheniramine-HYDROcodone (TUSSIONEX PENNKINETIC ER) 10-8 MG/5ML SUER    Sig: Take 5 mLs by mouth at bedtime as needed for up to 5 days for cough.    Dispense:  25 mL    Refill:  0    Order Specific Question:   Supervising Provider     Answer:   Smitty Cords [[id number]]    Follow up plan: Return 5-7 days if symptoms worsen or fail to improve.  Lindsey Mcardle, DNP, AGPCNP-BC Adult Gerontology Primary Care Nurse Practitioner [facility] 10/17/2018, 11:36 AM

## 2018-10-17 NOTE — Patient Instructions (Addendum)
Lindsey Webb,   Thank you for coming in to clinic today.  1. It sounds like you have an Upper Respiratory Bacterial Sinus infection.  Recommend good hand washing. - START taking Augmentin 875-125 mg once daily .  Make sure to take all doses of your antibiotic. - While you are on an antibiotic, take a probiotic.  Antibiotics kill good and bad bacteria.  A probiotic helps to replace your good bacteria. Probiotic pills can be found over the counter.  One brand is Florastor, but you can use any brand you prefer.  You can also get good bacteria from foods.  These foods are yogurt, kefir, kombucha, and fresh, refrigerated and uncooked sauerkraut. - Drink plenty of fluids.  - Start Atrovent nasal spray decongestant 2 sprays each nostril up to 4 times daily for 5-7 days - Continue anti-histamine loratadine 10mg  daily. - If congestion is worse, start OTC Mucinex (or may try Mucinex-DM for cough) up to 7-10 days then stop  Other over the counter medications you may try, if needed for symptoms are: - You may try over the counter Nasal Saline spray (Simply Saline, Ocean Spray) as needed to reduce congestion. - You may also use Flonase 2 sprays each nostril daily for up to 4-6 weeks- Start taking Tylenol extra strength 1 to 2 tablets every 6-8 hours for aches or fever/chills for next few days as needed.  Do not take more than 3,000 mg in 24 hours from all medicines.  You may also take ibuprofen 200-400mg  every 8 hours as needed.   - Drink warm herbal tea with honey for sore throat.   If symptoms are significantly worse with persistent fevers/chills despite tylenol/ibpurofen, nausea, vomiting unable to tolerate food/fluids or medicine, body aches, or shortness of breath, sinus pain pressure or worsening productive cough, then follow-up for re-evaluation, may seek more immediate care at Urgent Care or the ED if you are more concerned that it is an emergency.  Please schedule a follow-up appointment with [name], AGNP. Return 5-7 days if symptoms worsen or fail to improve.  If you have any other questions or concerns, please feel free to call the clinic or send a message through MyChart. You may also schedule an earlier appointment if necessary.  You will receive a survey after today's visit either digitally by e-mail or paper by [employer]. Your experiences and feedback matter to [place].  Please respond so we know how we are doing as we provide care for you.   [name], DNP, AGNP-BC Adult Gerontology Nurse Practitioner [facility], [facility]

## 2018-10-31 ENCOUNTER — Other Ambulatory Visit: Payer: Self-pay

## 2018-10-31 DIAGNOSIS — E785 Hyperlipidemia, unspecified: Secondary | ICD-10-CM

## 2018-11-03 ENCOUNTER — Other Ambulatory Visit: Payer: Self-pay | Admitting: Nurse Practitioner

## 2018-11-03 ENCOUNTER — Encounter: Payer: Self-pay | Admitting: Nurse Practitioner

## 2018-11-03 DIAGNOSIS — F324 Major depressive disorder, single episode, in partial remission: Secondary | ICD-10-CM

## 2018-11-03 DIAGNOSIS — E785 Hyperlipidemia, unspecified: Secondary | ICD-10-CM

## 2018-11-06 MED ORDER — SIMVASTATIN 40 MG PO TABS: 40.0000 mg | ORAL_TABLET | Freq: Every day | ORAL | 0 refills | Status: DC

## 2019-02-01 ENCOUNTER — Other Ambulatory Visit: Payer: Self-pay | Admitting: Nurse Practitioner

## 2019-02-01 DIAGNOSIS — J42 Unspecified chronic bronchitis: Secondary | ICD-10-CM

## 2019-02-01 DIAGNOSIS — E785 Hyperlipidemia, unspecified: Secondary | ICD-10-CM

## 2019-02-07 ENCOUNTER — Encounter: Payer: Self-pay | Admitting: Nurse Practitioner

## 2019-02-07 ENCOUNTER — Other Ambulatory Visit: Payer: Self-pay

## 2019-02-07 DIAGNOSIS — J42 Unspecified chronic bronchitis: Secondary | ICD-10-CM

## 2019-02-07 MED ORDER — ALBUTEROL SULFATE HFA 108 (90 BASE) MCG/ACT IN AERS: 1.0000 | INHALATION_SPRAY | Freq: Four times a day (QID) | RESPIRATORY_TRACT | 0 refills | Status: DC | PRN

## 2019-02-07 MED ORDER — SIMVASTATIN 40 MG PO TABS: 40.0000 mg | ORAL_TABLET | Freq: Every day | ORAL | 0 refills | Status: DC

## 2019-02-07 MED ORDER — BUDESONIDE-FORMOTEROL FUMARATE 160-4.5 MCG/ACT IN AERO: 1.0000 | INHALATION_SPRAY | Freq: Two times a day (BID) | RESPIRATORY_TRACT | 0 refills | Status: DC

## 2019-02-07 NOTE — Telephone Encounter (Signed)
Patient will need appt with labs before additional refills for cholesterol or blood pressure medication can be continued.  I have sent 30 day supply to allow for appointment to be scheduled.

## 2019-02-12 ENCOUNTER — Encounter: Payer: Self-pay | Admitting: Nurse Practitioner

## 2019-02-12 DIAGNOSIS — I10 Essential (primary) hypertension: Secondary | ICD-10-CM

## 2019-02-12 MED ORDER — LISINOPRIL 30 MG PO TABS: 30.0000 mg | ORAL_TABLET | Freq: Every day | ORAL | 1 refills | Status: DC

## 2019-02-25 ENCOUNTER — Other Ambulatory Visit: Payer: Self-pay | Admitting: Nurse Practitioner

## 2019-02-25 DIAGNOSIS — J42 Unspecified chronic bronchitis: Secondary | ICD-10-CM

## 2019-03-02 ENCOUNTER — Other Ambulatory Visit: Payer: Self-pay | Admitting: Nurse Practitioner

## 2019-03-02 DIAGNOSIS — E785 Hyperlipidemia, unspecified: Secondary | ICD-10-CM

## 2019-03-02 DIAGNOSIS — J42 Unspecified chronic bronchitis: Secondary | ICD-10-CM

## 2019-03-02 NOTE — Telephone Encounter (Signed)
Pharmacy requesting refills. Thanks!  

## 2019-03-05 ENCOUNTER — Other Ambulatory Visit: Payer: Self-pay

## 2019-03-05 DIAGNOSIS — J42 Unspecified chronic bronchitis: Secondary | ICD-10-CM

## 2019-03-05 MED ORDER — ALBUTEROL SULFATE HFA 108 (90 BASE) MCG/ACT IN AERS: 1.0000 | INHALATION_SPRAY | Freq: Four times a day (QID) | RESPIRATORY_TRACT | 2 refills | Status: AC | PRN

## 2019-03-06 ENCOUNTER — Other Ambulatory Visit: Payer: Self-pay

## 2019-03-06 ENCOUNTER — Other Ambulatory Visit: Payer: Self-pay | Admitting: Nurse Practitioner

## 2019-03-06 ENCOUNTER — Ambulatory Visit (INDEPENDENT_AMBULATORY_CARE_PROVIDER_SITE_OTHER): Payer: Medicare Other

## 2019-03-06 VITALS — BP 126/75 | HR 73 | Temp 97.8°F | Resp 16 | Ht 63.0 in | Wt 186.8 lb

## 2019-03-06 DIAGNOSIS — Z1211 Encounter for screening for malignant neoplasm of colon: Secondary | ICD-10-CM

## 2019-03-06 DIAGNOSIS — Z0000 Encounter for general adult medical examination without abnormal findings: Secondary | ICD-10-CM

## 2019-03-06 DIAGNOSIS — Z1159 Encounter for screening for other viral diseases: Secondary | ICD-10-CM | POA: Diagnosis not present

## 2019-03-06 DIAGNOSIS — F324 Major depressive disorder, single episode, in partial remission: Secondary | ICD-10-CM

## 2019-03-06 DIAGNOSIS — E785 Hyperlipidemia, unspecified: Secondary | ICD-10-CM

## 2019-03-06 MED ORDER — SIMVASTATIN 40 MG PO TABS: 40.0000 mg | ORAL_TABLET | Freq: Every day | ORAL | 0 refills | Status: DC

## 2019-03-06 MED ORDER — BUPROPION HCL ER (XL) 300 MG PO TB24: 300.0000 mg | ORAL_TABLET | Freq: Every day | ORAL | 0 refills | Status: DC

## 2019-03-06 NOTE — Progress Notes (Addendum)
Subjective:   Lindsey Webb is a 73 y.o. female who presents for Medicare Annual (Subsequent) preventive examination.  Review of Systems:   Cardiac Risk Factors include: advanced age (>69men, >100 women);dyslipidemia;hypertension;obesity (BMI >30kg/m2)     Objective:     Vitals: BP 126/75 (BP Location: Left Arm, Patient Position: Sitting, Cuff Size: Normal)   Pulse 73   Temp 97.8 F (36.6 C) (Oral)   Resp 16   Ht  (1.6 m)   Wt 186 lb 12.8 oz (84.7 kg)   BMI 33.09 kg/m   Body mass index is 33.09 kg/m.  Advanced Directives 03/06/2019 02/28/2018  Does Patient Have a Medical Advance Directive? No No  Would patient like information on creating a medical advance directive? Yes (MAU/Ambulatory/Procedural Areas - Information given) Yes (MAU/Ambulatory/Procedural Areas - Information given)   A voluntary discussion about advance care planning including the explanation and discussion of advance directives was extensively discussed  with the patient.  Explanation about the health care proxy and Living will was reviewed and packet with forms with explanation of how to fill them out was given.  During this discussion, the patient was able to identify a health care proxy as husband and plans to fill out the paperwork required.  Patient was offered a separate Advance Care Planning visit for further assistance with forms.   Time spent with patient: 16 minutes Individuals present: Verdie Drown (patient),  ,LPN    Tobacco Social History   Tobacco Use  Smoking Status Former Smoker  . Packs/day: 1.00  . Years: 45.00  . Pack years: 45.00  . Types: Cigarettes  . Last attempt to quit: 01/13/2016  . Years since quitting: 3.1  Smokeless Tobacco Never Used     Counseling given: Not Answered   Clinical Intake:  Pre-visit preparation completed: Yes  Pain : No/denies pain     Nutritional Status: BMI > 30  Obese Nutritional Risks: None Diabetes: No  How often do you need to  have someone help you when you read instructions, pamphlets, or other written materials from your doctor or pharmacy?: 1 - Never What is the last grade level you completed in school?: GED  Interpreter Needed?: No  Information entered by ::  ,LPN   Past Medical History:  Diagnosis Date  . Anxiety   . Arthritis    osteoarthritis  . COPD (chronic obstructive pulmonary disease) (HCC)   . Depression   . History of cervical cancer   . Hyperlipidemia   . Hypertension   . Osteopenia   . Sleep apnea   . Vitamin D deficiency    Past Surgical History:  Procedure Laterality Date  . ABDOMINAL HYSTERECTOMY  1982  . CHOLECYSTECTOMY  1981  . TONSILLECTOMY  1962   Family History  Problem Relation Age of Onset  . Prostate cancer Father   . Brain cancer Father   . Colon polyps Father   . Brain cancer Mother   . Brain cancer Paternal Grandmother    Social History   Socioeconomic History  . Marital status: Married    Spouse name: Not on file  . Number of children: Not on file  . Years of education: Not on file  . Highest education level: GED or equivalent  Occupational History  . Occupation: retired  Engineer, production  . Financial resource strain: Not hard at all  . Food insecurity:    Worry: Never true    Inability: Never true  . Transportation needs:    Medical:  No    Non-medical: No  Tobacco Use  . Smoking status: Former Smoker    Packs/day: 1.00    Years: 45.00    Pack years: 45.00    Types: Cigarettes    Last attempt to quit: 01/13/2016    Years since quitting: 3.1  . Smokeless tobacco: Never Used  Substance and Sexual Activity  . Alcohol use: No  . Drug use: No  . Sexual activity: Yes    Birth control/protection: Post-menopausal, Surgical  Lifestyle  . Physical activity:    Days per week: 6 days    Minutes per session: Not on file  . Stress: Not at all  Relationships  . Social connections:    Talks on phone: Never    Gets together: Once a week     Attends religious service: More than 4 times per year    Active member of club or organization: No    Attends meetings of clubs or organizations: Never    Relationship status: Married  Other Topics Concern  . Not on file  Social History Narrative   Retired, goes to church     Outpatient Encounter Medications as of 03/06/2019  Medication Sig  . albuterol (PROVENTIL HFA;VENTOLIN HFA) 108 (90 Base) MCG/ACT inhaler Inhale 1 puff into the lungs every 6 (six) hours as needed for wheezing or shortness of breath.  Marland Kitchen aspirin EC 81 MG tablet Take 81 mg by mouth daily.  . budesonide-formoterol (SYMBICORT) 160-4.5 MCG/ACT inhaler Inhale 1 puff into the lungs 2 (two) times daily.  Marland Kitchen buPROPion (WELLBUTRIN XL) 300 MG 24 hr tablet TAKE 1 TABLET BY MOUTH EVERY DAY  . calcium carbonate (CALCIUM 600) 600 MG TABS tablet Take 600 mg by mouth 2 (two) times daily with a meal.  . cholecalciferol (VITAMIN D) 400 units TABS tablet Take 5,000 Units by mouth daily.  Marland Kitchen lisinopril (PRINIVIL,ZESTRIL) 30 MG tablet Take 1 tablet (30 mg total) by mouth daily.  . simvastatin (ZOCOR) 40 MG tablet Take 1 tablet (40 mg total) by mouth daily. NEED APPT  . [DISCONTINUED] guaiFENesin (MUCINEX) 600 MG 12 hr tablet Take by mouth 2 (two) times daily.  . [DISCONTINUED] guaiFENesin-dextromethorphan (ROBITUSSIN DM) 100-10 MG/5ML syrup Take 5 mLs by mouth every 4 (four) hours as needed for cough.  . [DISCONTINUED] ipratropium (ATROVENT) 0.06 % nasal spray Place 2 sprays into both nostrils 4 (four) times daily for 5 days. (Patient not taking: Reported on 03/06/2019)  . [DISCONTINUED] Pseudoephedrine-APAP-DM (TYLENOL COLD/FLU SEVERE DAY PO) Take by mouth.   No facility-administered encounter medications on file as of 03/06/2019.     Activities of Daily Living In your present state of health, do you have any difficulty performing the following activities: 03/06/2019  Hearing? N  Vision? Y  Comment has cataracts, no eye dr  Difficulty  concentrating or making decisions? N  Walking or climbing stairs? Y  Comment knee pain   Dressing or bathing? N  Doing errands, shopping? N  Preparing Food and eating ? N  Using the Toilet? N  In the past six months, have you accidently leaked urine? N  Do you have problems with loss of bowel control? N  Managing your Medications? N  Managing your Finances? N  Housekeeping or managing your Housekeeping? N  Some recent data might be hidden    Patient Care Team: Galen Manila, NP as PCP - General (Nurse Practitioner)    Assessment:   This is a routine wellness examination for [place].  Exercise Activities  and Dietary recommendations Current Exercise Habits: Home exercise routine, Type of exercise: walking;strength training/weights, Time (Minutes): 30, Frequency (Times/Week): 7, Weekly Exercise (Minutes/Week): 210, Intensity: Mild, Exercise limited by: respiratory conditions(s)  Goals    . DIET - INCREASE WATER INTAKE      recommend drinking at least 6-8 glasses of water a day        Fall Risk: Fall Risk  03/06/2019 02/28/2018 09/19/2017  Falls in the past year? 0 No No  Risk for fall due to : - - History of fall(s)    FALL RISK PREVENTION PERTAINING TO THE HOME:  Any stairs in or around the home? No  If so, are there any without handrails? n/a  Home free of loose throw rugs in walkways, pet beds, electrical cords, etc? Yes  Adequate lighting in your home to reduce risk of falls? Yes   ASSISTIVE DEVICES UTILIZED TO PREVENT FALLS:  Life alert? No  Use of a cane, walker or w/c? No  Grab bars in the bathroom? yes Shower chair or bench in shower? No  Elevated toilet seat or a handicapped toilet? No   DME ORDERS:  DME order needed?  No   TIMED UP AND GO:  Was the test performed? Yes .  Length of time to ambulate 10 feet: 11 sec.   GAIT:  Appearance of gait: Gait steady and fast without the use of an assistive device.  Fall risk prevention has been  discussed.  Intervention(s) required? No   DME/home health order needed?  No    Depression Screen PHQ 2/9 Scores 03/06/2019 02/28/2018 07/12/2017  PHQ - 2 Score 0 0 2  PHQ- 9 Score - - 11     Cognitive Function     6CIT Screen 03/06/2019 02/28/2018  What Year? 0 points 0 points  What month? 0 points 0 points  What time? 0 points 0 points  Count back from 20 0 points 0 points  Months in reverse 0 points 0 points  Repeat phrase 0 points 0 points  Total Score 0 0    Immunization History  Administered Date(s) Administered  . Influenza, High Dose Seasonal PF 09/08/2018  . Influenza-Unspecified 09/20/2015, 10/01/2017  . Pneumococcal Conjugate-13 03/13/2014  . Pneumococcal-Unspecified 02/20/2013    Qualifies for Shingles Vaccine? Yes  Zostavax completed n/a. Due for Shingrix. Education has been provided regarding the importance of this vaccine. Pt has been advised to call insurance company to determine out of pocket expense. Advised may also receive vaccine at local pharmacy or Health Dept. Verbalized acceptance and understanding.  Tdap: Although this vaccine is not a covered service during a Wellness Exam, does the patient still wish to receive this vaccine today?  No .  Education has been provided regarding the importance of this vaccine. Advised may receive this vaccine at local pharmacy or Health Dept. Aware to provide a copy of the vaccination record if obtained from local pharmacy or Health Dept. Verbalized acceptance and understanding.  Flu Vaccine: up to date   Pneumococcal Vaccine: up to date   Screening Tests Health Maintenance  Topic Date Due  . Hepatitis C Screening  03-09-1946  . TETANUS/TDAP  02/07/1967  . COLONOSCOPY  12/21/2011  . MAMMOGRAM  02/18/2019  . INFLUENZA VACCINE  Completed  . DEXA SCAN  Completed  . PNA vac Low Risk Adult  Completed    Cancer Screenings:  Colorectal Screening: declined colonoscopy, declined cologuard. Agreed to yearly hemoccult  testing. patient provided with at home test and instructions  and was informed to bring it back to office when completed.    Mammogram:ordered , patient understands to call and schedule.   Bone Density: Completed 02/17/2017  Lung Cancer Screening: (Low Dose CT Chest recommended if Age 18-80 years, 30 pack-year currently smoking OR have quit w/in 15years.) does qualify.  Completed 03/22/2018, patient understands she is eligible yearly and someone will contact her to schedule her next screening when it is due.     Additional Screening:  Hepatitis C Screening: does qualify; ordered  Vision Screening: Recommended annual ophthalmology exams for early detection of glaucoma and other disorders of the eye. Is the patient up to date with their annual eye exam?  No   If pt is not established with a provider, would they like to be referred to a provider to establish care? No .   Dental Screening: Recommended annual dental exams for proper oral hygiene  Community Resource Referral:  CRR required this visit?  No       Plan:  I have personally reviewed and addressed the Medicare Annual Wellness questionnaire and have noted the following in the patient's chart:  A. Medical and social history B. Use of alcohol, tobacco or illicit drugs  C. Current medications and supplements D. Functional ability and status E.  Nutritional status F.  Physical activity G. Advance directives H. List of other physicians I.  Hospitalizations, surgeries, and ER visits in previous 12 months J.  Vitals K. Screenings such as hearing and vision if needed, cognitive and depression L. Referrals and appointments   In addition, I have reviewed and discussed with patient certain preventive protocols, quality metrics, and best practice recommendations. A written personalized care plan for preventive services as well as general preventive health recommendations were provided to patient. Nurse Health Advisor  Signed,     [place], Truddie [place], [place]  1/61/0960 Nurse Health Advisor   Nurse Notes: patient needs refills on her medications, advised she needs to schedule follow up with Elly Modena as she hasn't been seen since October for sick visit.   Advanced directive encounter completed

## 2019-03-06 NOTE — Patient Instructions (Addendum)
Lindsey Webb , Thank you for taking time to come for your Medicare Wellness Visit. I appreciate your ongoing commitment to your health goals. Please review the following plan we discussed and let me know if I can assist you in the future.   Screening recommendations/referrals: Colonoscopy: declined colonoscopy, hemoccult packet given. Please bring back to office when completed  Mammogram: Please call [phone] to schedule your mammogram.  Bone Density: up to date Recommended yearly ophthalmology/optometry visit for glaucoma screening and checkup Recommended yearly dental visit for hygiene and checkup  Vaccinations: Influenza vaccine: up to date Pneumococcal vaccine: up to date Tdap vaccine: due, check with your insurance company for coverage  Shingles vaccine: shingrix eligible, check with your insurance company for coverage     Advanced directives: Advance directive discussed with you today. I have provided a copy for you to complete at home and have notarized. Once this is complete please bring a copy in to our office so we can scan it into your chart.  Conditions/risks identified: history of smoking, due for lung cancer screening 03/23/2019, [name], RN will call you when it is time to schdedule   Next appointment: schedule a physical with [name], NP. Follow up In one year for your annual wellness exam.    Preventive Care 65 Years and Older, Female Preventive care refers to lifestyle choices and visits with your health care provider that can promote health and wellness. What does preventive care include?  A yearly physical exam. This is also called an annual well check.  Dental exams once or twice a year.  Routine eye exams. Ask your health care provider how often you should have your eyes checked.  Personal lifestyle choices, including:  Daily care of your teeth and gums.  Regular physical activity.  Eating a healthy diet.  Avoiding tobacco and drug use.   Limiting alcohol use.  Practicing safe sex.  Taking low-dose aspirin every day.  Taking vitamin and mineral supplements as recommended by your health care provider. What happens during an annual well check? The services and screenings done by your health care provider during your annual well check will depend on your age, overall health, lifestyle risk factors, and family history of disease. Counseling  Your health care provider may ask you questions about your:  Alcohol use.  Tobacco use.  Drug use.  Emotional well-being.  Home and relationship well-being.  Sexual activity.  Eating habits.  History of falls.  Memory and ability to understand (cognition).  Work and work Astronomer.  Reproductive health. Screening  You may have the following tests or measurements:  Height, weight, and BMI.  Blood pressure.  Lipid and cholesterol levels. These may be checked every 5 years, or more frequently if you are over 110 years old.  Skin check.  Lung cancer screening. You may have this screening every year starting at age 64 if you have a 30-pack-year history of smoking and currently smoke or have quit within the past 15 years.  Fecal occult blood test (FOBT) of the stool. You may have this test every year starting at age 48.  Flexible sigmoidoscopy or colonoscopy. You may have a sigmoidoscopy every 5 years or a colonoscopy every 10 years starting at age 47.  Hepatitis C blood test.  Hepatitis B blood test.  Sexually transmitted disease (STD) testing.  Diabetes screening. This is done by checking your blood sugar (glucose) after you have not eaten for a while (fasting). You may have this done every 1-3  years.  Bone density scan. This is done to screen for osteoporosis. You may have this done starting at age 62.  Mammogram. This may be done every 1-2 years. Talk to your health care provider about how often you should have regular mammograms. Talk with your health care  provider about your test results, treatment options, and if necessary, the need for more tests. Vaccines  Your health care provider may recommend certain vaccines, such as:  Influenza vaccine. This is recommended every year.  Tetanus, diphtheria, and acellular pertussis (Tdap, Td) vaccine. You may need a Td booster every 10 years.  Zoster vaccine. You may need this after age 75.  Pneumococcal 13-valent conjugate (PCV13) vaccine. One dose is recommended after age 24.  Pneumococcal polysaccharide (PPSV23) vaccine. One dose is recommended after age 36. Talk to your health care provider about which screenings and vaccines you need and how often you need them. This information is not intended to replace advice given to you by your health care provider. Make sure you discuss any questions you have with your health care provider. Document Released: 01/02/2016 Document Revised: 08/25/2016 Document Reviewed: 10/07/2015 Elsevier Interactive Patient Education  2017 [employer].  Fall Prevention in the Home Falls can cause injuries. They can happen to people of all ages. There are many things you can do to make your home safe and to help prevent falls. What can I do on the outside of my home?  Regularly fix the edges of walkways and driveways and fix any cracks.  Remove anything that might make you trip as you walk through a door, such as a raised step or threshold.  Trim any bushes or trees on the path to your home.  Use bright outdoor lighting.  Clear any walking paths of anything that might make someone trip, such as rocks or tools.  Regularly check to see if handrails are loose or broken. Make sure that both sides of any steps have handrails.  Any raised decks and porches should have guardrails on the edges.  Have any leaves, snow, or ice cleared regularly.  Use sand or salt on walking paths during winter.  Clean up any spills in your garage right away. This includes oil or grease  spills. What can I do in the bathroom?  Use night lights.  Install grab bars by the toilet and in the tub and shower. Do not use towel bars as grab bars.  Use non-skid mats or decals in the tub or shower.  If you need to sit down in the shower, use a plastic, non-slip stool.  Keep the floor dry. Clean up any water that spills on the floor as soon as it happens.  Remove soap buildup in the tub or shower regularly.  Attach bath mats securely with double-sided non-slip rug tape.  Do not have throw rugs and other things on the floor that can make you trip. What can I do in the bedroom?  Use night lights.  Make sure that you have a light by your bed that is easy to reach.  Do not use any sheets or blankets that are too big for your bed. They should not hang down onto the floor.  Have a firm chair that has side arms. You can use this for support while you get dressed.  Do not have throw rugs and other things on the floor that can make you trip. What can I do in the kitchen?  Clean up any spills right away.  Avoid walking on wet floors.  Keep items that you use a lot in easy-to-reach places.  If you need to reach something above you, use a strong step stool that has a grab bar.  Keep electrical cords out of the way.  Do not use floor polish or wax that makes floors slippery. If you must use wax, use non-skid floor wax.  Do not have throw rugs and other things on the floor that can make you trip. What can I do with my stairs?  Do not leave any items on the stairs.  Make sure that there are handrails on both sides of the stairs and use them. Fix handrails that are broken or loose. Make sure that handrails are as long as the stairways.  Check any carpeting to make sure that it is firmly attached to the stairs. Fix any carpet that is loose or worn.  Avoid having throw rugs at the top or bottom of the stairs. If you do have throw rugs, attach them to the floor with carpet  tape.  Make sure that you have a light switch at the top of the stairs and the bottom of the stairs. If you do not have them, ask someone to add them for you. What else can I do to help prevent falls?  Wear shoes that:  Do not have high heels.  Have rubber bottoms.  Are comfortable and fit you well.  Are closed at the toe. Do not wear sandals.  If you use a stepladder:  Make sure that it is fully opened. Do not climb a closed stepladder.  Make sure that both sides of the stepladder are locked into place.  Ask someone to hold it for you, if possible.  Clearly mark and make sure that you can see:  Any grab bars or handrails.  First and last steps.  Where the edge of each step is.  Use tools that help you move around (mobility aids) if they are needed. These include:  Canes.  Walkers.  Scooters.  Crutches.  Turn on the lights when you go into a dark area. Replace any light bulbs as soon as they burn out.  Set up your furniture so you have a clear path. Avoid moving your furniture around.  If any of your floors are uneven, fix them.  If there are any pets around you, be aware of where they are.  Review your medicines with your doctor. Some medicines can make you feel dizzy. This can increase your chance of falling. Ask your doctor what other things that you can do to help prevent falls. This information is not intended to replace advice given to you by your health care provider. Make sure you discuss any questions you have with your health care provider. Document Released: 10/02/2009 Document Revised: 05/13/2016 Document Reviewed: 01/10/2015 Elsevier Interactive Patient Education  2017 [employer].

## 2019-03-08 ENCOUNTER — Encounter: Payer: Self-pay | Admitting: *Deleted

## 2019-03-10 ENCOUNTER — Encounter: Payer: Self-pay | Admitting: *Deleted

## 2019-03-15 ENCOUNTER — Encounter: Payer: Medicare Other | Admitting: Nurse Practitioner

## 2019-04-03 ENCOUNTER — Other Ambulatory Visit: Payer: Self-pay | Admitting: Nurse Practitioner

## 2019-04-03 DIAGNOSIS — E785 Hyperlipidemia, unspecified: Secondary | ICD-10-CM

## 2019-04-03 DIAGNOSIS — F324 Major depressive disorder, single episode, in partial remission: Secondary | ICD-10-CM

## 2019-04-27 ENCOUNTER — Other Ambulatory Visit: Payer: Self-pay | Admitting: Nurse Practitioner

## 2019-04-27 DIAGNOSIS — F324 Major depressive disorder, single episode, in partial remission: Secondary | ICD-10-CM

## 2019-04-27 DIAGNOSIS — E785 Hyperlipidemia, unspecified: Secondary | ICD-10-CM

## 2019-05-08 ENCOUNTER — Telehealth: Payer: Self-pay | Admitting: *Deleted

## 2019-05-08 NOTE — Telephone Encounter (Signed)
Left message for patient to notify them that it is time to schedule annual low dose lung cancer screening CT scan. Instructed patient to call back to verify information prior to the scan being scheduled.  

## 2019-06-08 ENCOUNTER — Telehealth: Payer: Self-pay

## 2019-06-08 NOTE — Telephone Encounter (Signed)
Call pt regarding lung screening. Left message for pt to return call.  

## 2019-06-13 ENCOUNTER — Telehealth: Payer: Self-pay | Admitting: *Deleted

## 2019-06-13 DIAGNOSIS — Z87891 Personal history of nicotine dependence: Secondary | ICD-10-CM

## 2019-06-13 DIAGNOSIS — Z122 Encounter for screening for malignant neoplasm of respiratory organs: Secondary | ICD-10-CM

## 2019-06-13 NOTE — Telephone Encounter (Signed)
Patient has been notified that annual lung cancer screening low dose CT scan is due currently or will be in near future. Confirmed that patient is within the age range of 55-77, and asymptomatic, (no signs or symptoms of lung cancer). Patient denies illness that would prevent curative treatment for lung cancer if found. Verified smoking history, (former, quit 2017, 45 pack year). The shared decision making visit was done 03/21/18. Patient is agreeable for CT scan being scheduled.

## 2019-06-19 ENCOUNTER — Other Ambulatory Visit: Payer: Self-pay

## 2019-06-19 ENCOUNTER — Ambulatory Visit
Admission: RE | Admit: 2019-06-19 | Discharge: 2019-06-19 | Disposition: A | Discharge status: Home / Self Care | Payer: Medicare Other | Source: Ambulatory Visit | Attending: Oncology | Admitting: Oncology

## 2019-06-19 DIAGNOSIS — Z87891 Personal history of nicotine dependence: Secondary | ICD-10-CM | POA: Insufficient documentation

## 2019-06-19 DIAGNOSIS — Z122 Encounter for screening for malignant neoplasm of respiratory organs: Secondary | ICD-10-CM

## 2019-06-20 ENCOUNTER — Telehealth: Payer: Self-pay | Admitting: *Deleted

## 2019-06-20 NOTE — Telephone Encounter (Signed)
Notified patient of LDCT lung cancer screening program results with recommendation for 6 month follow up imaging. Also notified of incidental findings noted below and is encouraged to discuss further with PCP who will receive a copy of this note and/or the CT report. Patient verbalizes understanding.   IMPRESSION: Lung-RADS 3, probably benign findings. Short-term follow-up in 6 months is recommended with repeat low-dose chest CT without contrast (please use the following order, "CT CHEST: LCS NODULE FOLLOW-UP / CM").  Emphysema (ICD10-J43.9).  Aortic Atherosclerois (ICD10-170.0)

## 2019-07-22 ENCOUNTER — Other Ambulatory Visit: Payer: Self-pay | Admitting: Family Medicine

## 2019-07-22 DIAGNOSIS — E785 Hyperlipidemia, unspecified: Secondary | ICD-10-CM

## 2019-08-01 ENCOUNTER — Other Ambulatory Visit: Payer: Self-pay

## 2019-08-01 ENCOUNTER — Encounter: Payer: Self-pay | Admitting: Nurse Practitioner

## 2019-08-01 ENCOUNTER — Ambulatory Visit (INDEPENDENT_AMBULATORY_CARE_PROVIDER_SITE_OTHER): Payer: Medicare Other | Admitting: Nurse Practitioner

## 2019-08-01 VITALS — BP 121/55 | HR 79 | Ht 63.0 in | Wt 178.2 lb

## 2019-08-01 DIAGNOSIS — Z0000 Encounter for general adult medical examination without abnormal findings: Secondary | ICD-10-CM | POA: Diagnosis not present

## 2019-08-01 DIAGNOSIS — Z1159 Encounter for screening for other viral diseases: Secondary | ICD-10-CM

## 2019-08-01 DIAGNOSIS — Z1211 Encounter for screening for malignant neoplasm of colon: Secondary | ICD-10-CM

## 2019-08-01 DIAGNOSIS — Z1239 Encounter for other screening for malignant neoplasm of breast: Secondary | ICD-10-CM

## 2019-08-01 DIAGNOSIS — M810 Age-related osteoporosis without current pathological fracture: Secondary | ICD-10-CM

## 2019-08-01 DIAGNOSIS — L308 Other specified dermatitis: Secondary | ICD-10-CM | POA: Diagnosis not present

## 2019-08-01 DIAGNOSIS — E785 Hyperlipidemia, unspecified: Secondary | ICD-10-CM

## 2019-08-01 DIAGNOSIS — M8580 Other specified disorders of bone density and structure, unspecified site: Secondary | ICD-10-CM

## 2019-08-01 DIAGNOSIS — Z130 Encounter for screening for diseases of the blood and blood-forming organs and certain disorders involving the immune mechanism: Secondary | ICD-10-CM

## 2019-08-01 DIAGNOSIS — I10 Essential (primary) hypertension: Secondary | ICD-10-CM

## 2019-08-01 DIAGNOSIS — Z780 Asymptomatic menopausal state: Secondary | ICD-10-CM

## 2019-08-01 MED ORDER — TRIAMCINOLONE ACETONIDE 0.1 % EX CREA: 1.0000 "application " | TOPICAL_CREAM | Freq: Two times a day (BID) | CUTANEOUS | 1 refills | Status: DC

## 2019-08-01 NOTE — Progress Notes (Signed)
Subjective:    Patient ID: Lindsey Webb, female    DOB: 1946-06-04, 73 y.o.   MRN: [id number]  Lindsey Webb is a 73 y.o. female presenting on 08/01/2019 for Annual Exam   HPI Annual Physical Exam Patient has been feeling well.  They have no acute concerns today. Sleeps 5-6 hours per night interrupted 1-2 times for urination.  HEALTH MAINTENANCE: Weight/BMI: weight loss of 20 lbs in last 8 months.  Plateau at this time.  Goal: 150 Physical activity: resistance bands exercise, cardio/abs Diet: working to cut back Seatbelt: always Sunscreen: regularly PAP: n/a Mammogram: due DEXA: Last on 08/01/2017 with diagnosis of osteopenia; repeat due Colon Cancer Screen: due - patient had one polyp at last check;  HIV/HEP C: Hep C due Optometry: about 2-3 years ago Dentistry: not regularly   VACCINES: Tetanus: due Influenza: recommended Pneumonia: Completed Shingles: recommended    Pruritic rash Pruritic rash patient believes is eczema.  She has been using an OTC eczema cream with good relief of pruritus, but rash is persistent.  Rash appeared about 1 week ago and is first time patient has ever had this rash.  It is located on forearms, lower legs, and chest.    Past Medical History:  Diagnosis Date   Anxiety    Arthritis    osteoarthritis   COPD (chronic obstructive pulmonary disease) (HCC)    Depression    History of cervical cancer    Hyperlipidemia    Hypertension    Osteopenia    Sleep apnea    Vitamin D deficiency    Past Surgical History:  Procedure Laterality Date   ABDOMINAL HYSTERECTOMY  1982   CHOLECYSTECTOMY  1981   TONSILLECTOMY  1962   Social History   Socioeconomic History   Marital status: Married    Spouse name: Not on file   Number of children: Not on file   Years of education: Not on file   Highest education level: GED or equivalent  Occupational History   Occupation: retired  Scientist, product/process development strain: Not hard at  all   Food insecurity    Worry: Never true    Inability: Never true   Transportation needs    Medical: No    Non-medical: No  Tobacco Use   Smoking status: Former Smoker    Packs/day: 1.00    Years: 45.00    Pack years: 45.00    Types: Cigarettes    Quit date: 01/13/2016    Years since quitting: 3.5   Smokeless tobacco: Never Used  Substance and Sexual Activity   Alcohol use: No   Drug use: No   Sexual activity: Yes    Birth control/protection: Post-menopausal, Surgical  Lifestyle   Physical activity    Days per week: 6 days    Minutes per session: Not on file   Stress: Not at all  Relationships   Social connections    Talks on phone: Never    Gets together: Once a week    Attends religious service: More than 4 times per year    Active member of club or organization: No    Attends meetings of clubs or organizations: Never    Relationship status: Married   Intimate partner violence    Fear of current or ex partner: No    Emotionally abused: No    Physically abused: No    Forced sexual activity: No  Other Topics Concern   Not on file  Social History Narrative  Retired, goes to [employer]    Family History  Problem Relation Age of Onset   Prostate cancer Father    Brain cancer Father    Colon polyps Father    Brain cancer Mother    Brain cancer Paternal Grandmother    Current Outpatient Medications on File Prior to Visit  Medication Sig   albuterol (PROVENTIL HFA;VENTOLIN HFA) 108 (90 Base) MCG/ACT inhaler Inhale 1 puff into the lungs every 6 (six) hours as needed for wheezing or shortness of breath.   aspirin EC 81 MG tablet Take 81 mg by mouth daily.   budesonide-formoterol (SYMBICORT) 160-4.5 MCG/ACT inhaler Inhale 1 puff into the lungs 2 (two) times daily.   buPROPion (WELLBUTRIN XL) 300 MG 24 hr tablet TAKE 1 TABLET BY MOUTH EVERY DAY   calcium carbonate (CALCIUM 600) 600 MG TABS tablet Take 600 mg by mouth 2 (two) times daily with a meal.    cholecalciferol (VITAMIN D) 400 units TABS tablet Take 5,000 Units by mouth daily.   lisinopril (PRINIVIL,ZESTRIL) 30 MG tablet Take 1 tablet (30 mg total) by mouth daily.   simvastatin (ZOCOR) 40 MG tablet TAKE 1 TABLET BY MOUTH EVERY DAY   No current facility-administered medications on file prior to visit.     Review of Systems Per HPI unless specifically indicated above     Objective:    BP (!) 121/55 (BP Location: Right Arm, Patient Position: Sitting, Cuff Size: Normal)    Pulse 79    Ht 5\' 3"  (1.6 m)    Wt 178 lb 3.2 oz (80.8 kg)    SpO2 99%    BMI 31.57 kg/m   Wt Readings from Last 3 Encounters:  08/01/19 178 lb 3.2 oz (80.8 kg)  06/19/19 180 lb (81.6 kg)  03/06/19 186 lb 12.8 oz (84.7 kg)    Physical Exam Vitals signs and nursing note reviewed.  Constitutional:      General: She is not in acute distress.    Appearance: Normal appearance. She is well-developed. She is obese.  HENT:     Head: Normocephalic and atraumatic.     Right Ear: Tympanic membrane, ear canal and external ear normal.     Left Ear: Tympanic membrane, ear canal and external ear normal.     Nose: Nose normal.     Mouth/Throat:     Mouth: Mucous membranes are moist.     Pharynx: Oropharynx is clear.  Eyes:     Conjunctiva/sclera: Conjunctivae normal.     Pupils: Pupils are equal, round, and reactive to light.  Neck:     Musculoskeletal: Normal range of motion and neck supple.     Thyroid: No thyromegaly.     Vascular: No JVD.     Trachea: No tracheal deviation.  Cardiovascular:     Rate and Rhythm: Normal rate and regular rhythm.     Heart sounds: Normal heart sounds. No murmur. No friction rub. No gallop.   Pulmonary:     Effort: Pulmonary effort is normal. No respiratory distress.     Breath sounds: Normal breath sounds.  Abdominal:     General: Bowel sounds are normal. There is no distension.     Palpations: Abdomen is soft.     Tenderness: There is no abdominal tenderness.    Musculoskeletal: Normal range of motion.  Lymphadenopathy:     Cervical: No cervical adenopathy.  Skin:    General: Skin is warm and dry.     Capillary Refill: Capillary refill takes less  than 2 seconds.     Findings: Rash present.     Comments: Red macular rash pruritus.  Without flakes  Neurological:     General: No focal deficit present.     Mental Status: She is alert and oriented to person, place, and time. Mental status is at baseline.     Cranial Nerves: No cranial nerve deficit.  Psychiatric:        Mood and Affect: Mood normal.        Behavior: Behavior normal.        Thought Content: Thought content normal.        Judgment: Judgment normal.    Results for orders placed or performed in visit on 01/12/18  Lipid panel  Result Value Ref Range   Cholesterol 142 <200 mg/dL   HDL 59 >16>50 mg/dL   Triglycerides 109102 <604<150 mg/dL   LDL Cholesterol (Calc) 64 mg/dL (calc)   Total CHOL/HDL Ratio 2.4 <5.0 (calc)   Non-HDL Cholesterol (Calc) 83 <540<130 mg/dL (calc)  Comprehensive metabolic panel  Result Value Ref Range   Glucose, Bld 105 (H) 65 - 99 mg/dL   BUN 11 7 - 25 mg/dL   Creat 9.810.71 1.910.50 - 4.780.99 mg/dL   BUN/Creatinine Ratio NOT APPLICABLE 6 - 22 (calc)   Sodium 140 135 - 146 mmol/L   Potassium 4.1 3.5 - 5.3 mmol/L   Chloride 104 98 - 110 mmol/L   CO2 29 20 - 32 mmol/L   Calcium 9.4 8.6 - 10.4 mg/dL   Total Protein 6.5 6.1 - 8.1 g/dL   Albumin 4.1 3.6 - 5.1 g/dL   Globulin 2.4 1.9 - 3.7 g/dL (calc)   AG Ratio 1.7 1.0 - 2.5 (calc)   Total Bilirubin 0.5 0.2 - 1.2 mg/dL   Alkaline phosphatase (APISO) 111 33 - 130 U/L   AST 17 10 - 35 U/L   ALT 16 6 - 29 U/L      Assessment & Plan:   Problem List Items Addressed This Visit      Cardiovascular and Mediastinum   Hypertension   Relevant Orders   COMPLETE METABOLIC PANEL WITH GFR     Other   Hyperlipidemia   Relevant Orders   Lipid panel    Other Visit Diagnoses    Other eczema    -  Primary   Relevant Medications    triamcinolone cream (KENALOG) 0.1 %   Encounter for annual physical exam       Encounter for hepatitis C screening test for low risk patient       Relevant Orders   Hepatitis C antibody   Screening for colon cancer       Relevant Orders   Cologuard   Screening, deficiency anemia, iron       Relevant Orders   CBC with Differential/Platelet   Osteopenia after menopause       Relevant Orders   TSH   DG Bone Density   Breast cancer screening       Relevant Orders   MM DIGITAL SCREENING BILATERAL      Annual physical exam with new findings of eczema rash.  Well adult with no other acute concerns.  Plan: 1. Obtain health maintenance screenings as above according to age. - Increase physical activity to 30 minutes most days of the week.  - Eat healthy diet high in vegetables and fruits; low in refined carbohydrates. - Screening and monitoring labs and tests as ordered. 2. Return 1 year for annual physical.  Mild eczema  Patient with new eczema like rash with macular erythematous eruptions. Considered vascular dermatitis, but unlikely given location on chest.  Plan: 1. START triamcinolone 0.1% bid x 7 days.  May repeat as needed. 2. Follow-up with dermatology if rash persists without improvement.  May need skin biopsy.    Meds ordered this encounter  Medications   triamcinolone cream (KENALOG) 0.1 %    Sig: Apply 1 application topically 2 (two) times daily. For 7 days then stop and repeat as needed.    Dispense:  30 g    Refill:  1    Order Specific Question:   Supervising Provider    Answer:   Smitty CordsKARAMALEGOS, ALEXANDER J [[id number]]    Follow up plan: Return in about 1 year (around 07/31/2020) for annual physical AND 6 months for blood pressure.  Wilhelmina McardleLauren Khoury Siemon, DNP, AGPCNP-BC Adult Gerontology Primary Care Nurse Practitioner [facility] 08/01/2019, 9:03 AM

## 2019-08-01 NOTE — Patient Instructions (Signed)
Lindsey Webb,   Thank you for coming in to clinic today.  1. Your mammogram order has been placed.  Call the Scheduling phone number at [phone] to schedule your mammogram at your convenience.  You can choose to go to either location listed below.  Let the scheduler know which location you prefer. **[facility]  [facility]  [place], [place] [address]   [facility] [address] [place], [place] [address]  2. Tetanus and Shingles vaccines at your pharmacy.   - Shingrix for shingles vaccines (2 doses)  Please schedule a follow-up appointment with Cassell Smiles, AGNP. Return in about 1 year (around 07/31/2020) for annual physical AND 6 months for blood pressure.  If you have any other questions or concerns, please feel free to call the clinic or send a message through [facility]. You may also schedule an earlier appointment if necessary.  You will receive a survey after today's visit either digitally by e-mail or paper by [employer]. Your experiences and feedback matter to [place].  Please respond so we know how we are doing as we provide care for you.   Cassell Smiles, DNP, AGNP-BC Adult Gerontology Nurse Practitioner [place]

## 2019-08-02 LAB — COMPLETE METABOLIC PANEL WITH GFR
AG Ratio: 1.7 (calc) (ref 1.0–2.5)
ALT: 18 U/L (ref 6–29)
AST: 19 U/L (ref 10–35)
Albumin: 4.5 g/dL (ref 3.6–5.1)
Alkaline phosphatase (APISO): 101 U/L (ref 37–153)
BUN: 20 mg/dL (ref 7–25)
CO2: 28 mmol/L (ref 20–32)
Calcium: 10 mg/dL (ref 8.6–10.4)
Chloride: 104 mmol/L (ref 98–110)
Creat: 0.71 mg/dL (ref 0.60–0.93)
GFR, Est African American: 99 mL/min/{1.73_m2} (ref >=60)
GFR, Est Non African American: 86 mL/min/{1.73_m2} (ref >=60)
Globulin: 2.6 g/dL (calc) (ref 1.9–3.7)
Glucose, Bld: 94 mg/dL (ref 65–99)
Potassium: 4.7 mmol/L (ref 3.5–5.3)
Sodium: 141 mmol/L (ref 135–146)
Total Bilirubin: 0.7 mg/dL (ref 0.2–1.2)
Total Protein: 7.1 g/dL (ref 6.1–8.1)

## 2019-08-02 LAB — TSH: TSH: 0.8 mIU/L (ref 0.40–4.50)

## 2019-08-02 LAB — CBC WITH DIFFERENTIAL/PLATELET
Absolute Monocytes: 358 cells/uL (ref 200–950)
Basophils Absolute: 77 cells/uL (ref 0–200)
Basophils Relative: 1.2 %
Eosinophils Absolute: 192 cells/uL (ref 15–500)
Eosinophils Relative: 3 %
HCT: 43.8 % (ref 35.0–45.0)
Hemoglobin: 14.7 g/dL (ref 11.7–15.5)
Lymphs Abs: 2336 cells/uL (ref 850–3900)
MCH: 31 pg (ref 27.0–33.0)
MCHC: 33.6 g/dL (ref 32.0–36.0)
MCV: 92.4 fL (ref 80.0–100.0)
MPV: 11.1 fL (ref 7.5–12.5)
Monocytes Relative: 5.6 %
Neutro Abs: 3437 cells/uL (ref 1500–7800)
Neutrophils Relative %: 53.7 %
Platelets: 260 10*3/uL (ref 140–400)
RBC: 4.74 10*6/uL (ref 3.80–5.10)
RDW: 12.2 % (ref 11.0–15.0)
Total Lymphocyte: 36.5 %
WBC: 6.4 10*3/uL (ref 3.8–10.8)

## 2019-08-02 LAB — LIPID PANEL
Cholesterol: 148 mg/dL (ref <200)
HDL: 58 mg/dL (ref >=50)
LDL Cholesterol (Calc): 71 mg/dL (calc)
Non-HDL Cholesterol (Calc): 90 mg/dL (calc) (ref <130)
Total CHOL/HDL Ratio: 2.6 (calc) (ref <5.0)
Triglycerides: 105 mg/dL (ref <150)

## 2019-08-02 LAB — HEPATITIS C ANTIBODY
Hepatitis C Ab: NONREACTIVE
SIGNAL TO CUT-OFF: 0.02 (ref <1.00)

## 2019-08-04 ENCOUNTER — Other Ambulatory Visit: Payer: Self-pay | Admitting: Nurse Practitioner

## 2019-08-04 DIAGNOSIS — I10 Essential (primary) hypertension: Secondary | ICD-10-CM

## 2019-10-08 ENCOUNTER — Ambulatory Visit
Admission: RE | Admit: 2019-10-08 | Discharge: 2019-10-08 | Disposition: A | Discharge status: Home / Self Care | Payer: Medicare Other | Source: Ambulatory Visit | Attending: Nurse Practitioner | Admitting: Nurse Practitioner

## 2019-10-08 DIAGNOSIS — M8580 Other specified disorders of bone density and structure, unspecified site: Secondary | ICD-10-CM | POA: Insufficient documentation

## 2019-10-08 DIAGNOSIS — Z780 Asymptomatic menopausal state: Secondary | ICD-10-CM | POA: Diagnosis not present

## 2019-10-08 DIAGNOSIS — Z1239 Encounter for other screening for malignant neoplasm of breast: Secondary | ICD-10-CM

## 2019-10-08 DIAGNOSIS — Z13820 Encounter for screening for osteoporosis: Secondary | ICD-10-CM | POA: Diagnosis not present

## 2019-10-08 DIAGNOSIS — Z1231 Encounter for screening mammogram for malignant neoplasm of breast: Secondary | ICD-10-CM | POA: Diagnosis not present

## 2019-10-09 LAB — COLOGUARD: Cologuard: NEGATIVE

## 2019-10-16 ENCOUNTER — Other Ambulatory Visit: Payer: Self-pay | Admitting: Nurse Practitioner

## 2019-10-16 ENCOUNTER — Telehealth: Payer: Self-pay

## 2019-10-16 DIAGNOSIS — E785 Hyperlipidemia, unspecified: Secondary | ICD-10-CM

## 2019-10-16 NOTE — Telephone Encounter (Signed)
The pt was notified of her Cologuard results. She verbalize understanding.

## 2019-10-16 NOTE — Telephone Encounter (Signed)
Attempted to contact the pt to notify her of her negative cologuard result. No answer, lmom to return my call.

## 2019-10-22 ENCOUNTER — Inpatient Hospital Stay
Admission: RE | Admit: 2019-10-22 | Discharge: 2019-10-22 | Disposition: A | Discharge status: Home / Self Care | Payer: Self-pay | Source: Ambulatory Visit | Attending: *Deleted | Admitting: *Deleted

## 2019-10-22 ENCOUNTER — Other Ambulatory Visit: Payer: Self-pay | Admitting: *Deleted

## 2019-10-22 DIAGNOSIS — Z1231 Encounter for screening mammogram for malignant neoplasm of breast: Secondary | ICD-10-CM

## 2019-10-28 ENCOUNTER — Other Ambulatory Visit: Payer: Self-pay | Admitting: Family Medicine

## 2019-10-28 DIAGNOSIS — F324 Major depressive disorder, single episode, in partial remission: Secondary | ICD-10-CM

## 2019-12-10 ENCOUNTER — Other Ambulatory Visit: Payer: Self-pay

## 2019-12-10 ENCOUNTER — Encounter: Payer: Self-pay | Admitting: Physician Assistant

## 2019-12-10 ENCOUNTER — Ambulatory Visit (INDEPENDENT_AMBULATORY_CARE_PROVIDER_SITE_OTHER): Payer: Medicare Other | Admitting: Physician Assistant

## 2019-12-10 VITALS — BP 134/86 | HR 97 | Temp 97.1°F | Ht 62.0 in | Wt 183.4 lb

## 2019-12-10 DIAGNOSIS — E785 Hyperlipidemia, unspecified: Secondary | ICD-10-CM | POA: Diagnosis not present

## 2019-12-10 DIAGNOSIS — F329 Major depressive disorder, single episode, unspecified: Secondary | ICD-10-CM

## 2019-12-10 DIAGNOSIS — I10 Essential (primary) hypertension: Secondary | ICD-10-CM

## 2019-12-10 DIAGNOSIS — F324 Major depressive disorder, single episode, in partial remission: Secondary | ICD-10-CM

## 2019-12-10 DIAGNOSIS — J42 Unspecified chronic bronchitis: Secondary | ICD-10-CM

## 2019-12-10 DIAGNOSIS — M8589 Other specified disorders of bone density and structure, multiple sites: Secondary | ICD-10-CM

## 2019-12-10 DIAGNOSIS — F32A Depression, unspecified: Secondary | ICD-10-CM

## 2019-12-10 MED ORDER — SIMVASTATIN 40 MG PO TABS: 40.0000 mg | ORAL_TABLET | Freq: Every day | ORAL | 1 refills | Status: DC

## 2019-12-10 MED ORDER — BUDESONIDE-FORMOTEROL FUMARATE 160-4.5 MCG/ACT IN AERO: 1.0000 | INHALATION_SPRAY | Freq: Two times a day (BID) | RESPIRATORY_TRACT | 1 refills | Status: DC

## 2019-12-10 NOTE — Progress Notes (Signed)
Patient: Lindsey Webb, Female    DOB: [birth year], 73 y.o.   MRN: [id number] Visit Date: 12/10/2019  Today's Provider: Trey Sailors, PA-C   Chief Complaint  Patient presents with  . New Patient (Initial Visit)   Subjective:    New Patient Appointment Lindsey Webb is a 73 y.o. female who presents today for new patient appointment.   Two grown sons, live with husband of two years. Five grandchildren and one great grandchild.   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   COPD: history of smoking 45 pack years, gets low dose lung screening. Due for screening in a month or two. Using symbicort and ventolin.   HTN: Lisinopril 30 mg daily no issue.   HLD: simvastatin 40 mg daily.   Lipid Panel     Component Value Date/Time   CHOL 148 08/01/2019 0934   TRIG 105 08/01/2019 0934   HDL 58 08/01/2019 0934   CHOLHDL 2.6 08/01/2019 0934   LDLCALC 71 08/01/2019 0934   Anxiety and depression: wellbutrin 300 mg XL once daily.  ----------------------------------------------------------------- Last Pap:02/22/2013 Last Mammogram: 10/22/2019 Last Colonoscopy: 12/20/2001 Last Bone density: 10/08/2019   Review of Systems  Constitutional: Negative.   HENT: Negative.   Eyes: Negative.   Respiratory: Negative.   Cardiovascular: Negative.   Gastrointestinal: Negative.   Endocrine: Negative.   Genitourinary: Negative.   Musculoskeletal: Negative.   Skin: Negative.   Allergic/Immunologic: Negative.   Neurological: Negative.   Hematological: Negative.   Psychiatric/Behavioral: Negative.     Social History She  reports that she quit smoking about 3 years ago. Her smoking use included cigarettes. She has a 45.00 pack-year smoking history. She has never used smokeless tobacco. She reports that she does not drink alcohol or use drugs. Social History   Socioeconomic History  . Marital status: Married    Spouse name: Not on file  . Number of children: Not on file  . Years  of education: Not on file  . Highest education level: GED or equivalent  Occupational History  . Occupation: retired  Tobacco Use  . Smoking status: Former Smoker    Packs/day: 1.00    Years: 45.00    Pack years: 45.00    Types: Cigarettes    Quit date: 01/13/2016    Years since quitting: 3.9  . Smokeless tobacco: Never Used  Substance and Sexual Activity  . Alcohol use: No  . Drug use: No  . Sexual activity: Yes    Birth control/protection: Post-menopausal, Surgical  Other Topics Concern  . Not on file  Social History Narrative   Retired, goes to [employer] Strain:   . Difficulty of Paying Living Expenses: Not on file  Food Insecurity:   . Worried About Programme researcher, broadcasting/film/video in the Last Year: Not on file  . Ran Out of Food in the Last Year: Not on file  Transportation Needs:   . Lack of Transportation (Medical): Not on file  . Lack of Transportation (Non-Medical): Not on file  Physical Activity: Unknown  . Days of Exercise per Week: 6 days  . Minutes of Exercise per Session: Not asked  Stress:   . Feeling of Stress : Not on file  Social Connections:   . Frequency of Communication with Friends and Family: Not on file  . Frequency of Social Gatherings with Friends and Family: Not on file  . Attends Religious Services:  Not on file  . Active Member of Clubs or Organizations: Not on file  . Attends BankerClub or Organization Meetings: Not on file  . Marital Status: Not on file    Patient Active Problem List   Diagnosis Date Noted  . Atherosclerosis of coronary artery without angina pectoris 03/24/2018  . Hypertension 07/12/2017  . COPD (chronic obstructive pulmonary disease) (HCC) 07/12/2017  . Osteopenia 07/12/2017  . Depression 07/12/2017  . History of cervical cancer 07/12/2017  . Hyperlipidemia 07/12/2017  . Vitamin D deficiency 07/12/2017    Past Surgical History:  Procedure Laterality Date  . ABDOMINAL  HYSTERECTOMY  1982  . CHOLECYSTECTOMY  1981  . TONSILLECTOMY  1962    Family History  Family Status  Relation Name Status  . Father  Deceased at age 71  . Mother  Deceased at age 71  . PGM  (Not Specified)   Her family history includes Brain cancer in her father, mother, and paternal grandmother; Colon polyps in her father; Prostate cancer in her father.     Allergies  Allergen Reactions  . Latex Itching and Rash  . Sodium Hypochlorite Itching and Rash    Clorox    Previous Medications   ALBUTEROL (PROVENTIL HFA;VENTOLIN HFA) 108 (90 BASE) MCG/ACT INHALER    Inhale 1 puff into the lungs every 6 (six) hours as needed for wheezing or shortness of breath.   ASPIRIN EC 81 MG TABLET    Take 81 mg by mouth daily.   BUDESONIDE-FORMOTEROL (SYMBICORT) 160-4.5 MCG/ACT INHALER    Inhale 1 puff into the lungs 2 (two) times daily.   BUPROPION (WELLBUTRIN XL) 300 MG 24 HR TABLET    TAKE 1 TABLET BY MOUTH EVERY DAY   CALCIUM CARBONATE (CALCIUM 600) 600 MG TABS TABLET    Take 600 mg by mouth 2 (two) times daily with a meal.   CHOLECALCIFEROL (VITAMIN D) 400 UNITS TABS TABLET    Take 5,000 Units by mouth daily.   LISINOPRIL (ZESTRIL) 30 MG TABLET    TAKE 1 TABLET BY MOUTH EVERY DAY   SIMVASTATIN (ZOCOR) 40 MG TABLET    TAKE 1 TABLET BY MOUTH EVERY DAY   TRIAMCINOLONE CREAM (KENALOG) 0.1 %    Apply 1 application topically 2 (two) times daily. For 7 days then stop and repeat as needed.    Patient Care Team: Maryella ShiversPollak, Jerin Franzel M, PA-C as PCP - General (Physician Assistant)      Objective:   Vitals: BP (!) 162/94 (BP Location: Left Arm, Patient Position: Sitting, Cuff Size: Normal)   Pulse 97   Temp (!) 97.1 F (36.2 C) (Temporal)   Ht 5\' 2"  (1.575 m)   Wt 183 lb 6.4 oz (83.2 kg)   BMI 33.54 kg/m    Physical Exam Constitutional:      Appearance: Normal appearance.  Cardiovascular:     Rate and Rhythm: Normal rate and regular rhythm.     Heart sounds: Normal heart sounds.  Pulmonary:      Effort: Pulmonary effort is normal.     Breath sounds: Normal breath sounds.  Skin:    General: Skin is warm and dry.  Neurological:     Mental Status: She is alert and oriented to person, place, and time. Mental status is at baseline.  Psychiatric:        Mood and Affect: Mood normal.        Behavior: Behavior normal.      Depression Screen [facility] Sc[facility] 12/10/2019 08/01/2019 03/06/2019  02/28/2018  PHQ - 2 Score 3 0 0 0  PHQ- 9 Score 9 0 - -      Assessment & Plan:     Routine Health Maintenance and Physical Exam  Exercise Activities and Dietary recommendations Goals    . DIET - INCREASE WATER INTAKE      recommend drinking at least 6-8 glasses of water a day        Immunization History  Administered Date(s) Administered  . Influenza, High Dose Seasonal PF 09/08/2018, 10/07/2019  . Influenza-Unspecified 09/20/2015, 10/01/2017  . Pneumococcal Conjugate-13 03/13/2014  . Pneumococcal-Unspecified 02/20/2013    Health Maintenance  Topic Date Due  . TETANUS/TDAP  02/07/1967  . MAMMOGRAM  10/07/2021  . Fecal DNA (Cologuard)  10/08/2022  . INFLUENZA VACCINE  Completed  . DEXA SCAN  Completed  . Hepatitis C Screening  Completed  . PNA vac Low Risk Adult  Completed     Discussed health benefits of physical activity, and encouraged her to engage in regular exercise appropriate for her age and condition.    1. Depression, unspecified depression type  Continue current medications.  2. Essential hypertension  Continue current medications.  3. Chronic bronchitis, unspecified chronic bronchitis type (HCC)  Continue.   - budesonide-formoterol (SYMBICORT) 160-4.5 MCG/ACT inhaler; Inhale 1 puff into the lungs 2 (two) times daily.  Dispense: 3 Inhaler; Refill: 1  4. Hyperlipidemia, unspecified hyperlipidemia type  Continue.  - simvastatin (ZOCOR) 40 MG tablet; Take 1 tablet (40 mg total) by mouth daily.  Dispense: 90 tablet; Refill: 1  5. Osteopenia of multiple  sites  Repeat DEXA one year.   6. Major depressive disorder with single episode, in partial remission [facility])  The entirety of the information documented in the History of Present Illness, Review of Systems and Physical Exam were personally obtained by me. Portions of this information were initially documented by [facility] and reviewed by me for thoroughness and accuracy.   F/u for [facility] and in 6 months for CPE and follow up.   --------------------------------------------------------------------

## 2019-12-10 NOTE — Patient Instructions (Signed)

## 2019-12-12 DIAGNOSIS — F324 Major depressive disorder, single episode, in partial remission: Secondary | ICD-10-CM | POA: Insufficient documentation

## 2019-12-17 ENCOUNTER — Telehealth: Payer: Self-pay | Admitting: *Deleted

## 2019-12-17 NOTE — Telephone Encounter (Signed)
Left message for patient to notify them that it is time to schedule low dose lung cancer screening CT scan. Instructed patient to call back to verify information prior to the scan being scheduled.  

## 2020-01-07 ENCOUNTER — Telehealth: Payer: Self-pay | Admitting: *Deleted

## 2020-01-07 DIAGNOSIS — Z87891 Personal history of nicotine dependence: Secondary | ICD-10-CM

## 2020-01-07 DIAGNOSIS — R918 Other nonspecific abnormal finding of lung field: Secondary | ICD-10-CM

## 2020-01-07 NOTE — Telephone Encounter (Signed)
Patient returned contact attempts and is scheduled for LCS nodule follow up scan.

## 2020-01-09 ENCOUNTER — Ambulatory Visit
Admission: RE | Admit: 2020-01-09 | Discharge: 2020-01-09 | Disposition: A | Discharge status: Home / Self Care | Payer: Medicare Other | Source: Ambulatory Visit | Attending: Oncology | Admitting: Oncology

## 2020-01-09 ENCOUNTER — Other Ambulatory Visit: Payer: Self-pay

## 2020-01-09 DIAGNOSIS — R918 Other nonspecific abnormal finding of lung field: Secondary | ICD-10-CM | POA: Insufficient documentation

## 2020-01-09 DIAGNOSIS — Z87891 Personal history of nicotine dependence: Secondary | ICD-10-CM | POA: Insufficient documentation

## 2020-01-11 ENCOUNTER — Encounter: Payer: Self-pay | Admitting: *Deleted

## 2020-01-26 ENCOUNTER — Other Ambulatory Visit: Payer: Self-pay | Admitting: Nurse Practitioner

## 2020-01-26 DIAGNOSIS — I10 Essential (primary) hypertension: Secondary | ICD-10-CM

## 2020-02-01 ENCOUNTER — Ambulatory Visit: Payer: Medicare Other | Admitting: Nurse Practitioner

## 2020-02-12 ENCOUNTER — Encounter: Payer: Self-pay | Admitting: Physician Assistant

## 2020-02-12 DIAGNOSIS — E785 Hyperlipidemia, unspecified: Secondary | ICD-10-CM

## 2020-02-12 MED ORDER — SIMVASTATIN 40 MG PO TABS: 40.0000 mg | ORAL_TABLET | Freq: Every day | ORAL | 1 refills | Status: DC

## 2020-03-06 NOTE — Progress Notes (Signed)
Subjective:   Lindsey Webb is a 74 y.o. female who presents for Medicare Annual (Subsequent) preventive examination.    This visit is being conducted through telemedicine due to the COVID-19 pandemic. This patient has given me verbal consent via doximity to conduct this visit, patient states they are participating from their home address. Some vital signs may be absent or patient reported.    Patient identification: identified by name, DOB, and current address  Review of Systems:  N/A  Cardiac Risk Factors include: advanced age (>60men, >78 women);dyslipidemia;hypertension     Objective:     Vitals: There were no vitals taken for this visit.  There is no height or weight on file to calculate BMI. Unable to obtain vitals due to visit being conducted via telephonically.   Advanced Directives 03/10/2020 03/06/2019 02/28/2018  Does Patient Have a Medical Advance Directive? No No No  Does patient want to make changes to medical advance directive? No - Patient declined - -  Would patient like information on creating a medical advance directive? - Yes (MAU/Ambulatory/Procedural Areas - Information given) Yes (MAU/Ambulatory/Procedural Areas - Information given)    Tobacco Social History   Tobacco Use  Smoking Status Former Smoker  . Packs/day: 1.00  . Years: 45.00  . Pack years: 45.00  . Types: Cigarettes  . Quit date: 01/13/2016  . Years since quitting: 4.1  Smokeless Tobacco Never Used     Counseling given: Not Answered   Clinical Intake:  Pre-visit preparation completed: Yes  Pain : No/denies pain Pain Score: 0-No pain     Nutritional Risks: None Diabetes: No  How often do you need to have someone help you when you read instructions, pamphlets, or other written materials from your doctor or pharmacy?: 1 - Never  Interpreter Needed?: No  Information entered by :: [facility], LPN  Past Medical History:  Diagnosis Date  . Anxiety   . Arthritis    osteoarthritis    . COPD (chronic obstructive pulmonary disease) ([place])   . Depression   . History of cervical cancer   . Hyperlipidemia   . Hypertension   . Osteopenia   . Sleep apnea   . Vitamin D deficiency    Past Surgical History:  Procedure Laterality Date  . ABDOMINAL HYSTERECTOMY  1982  . CHOLECYSTECTOMY  1981  . TONSILLECTOMY  1962   Family History  Problem Relation Age of Onset  . Prostate cancer Father   . Brain cancer Father   . Colon polyps Father   . Brain cancer Mother   . Brain cancer Paternal Grandmother    Social History   Socioeconomic History  . Marital status: Married    Spouse name: Not on file  . Number of children: 2  . Years of education: Not on file  . Highest education level: GED or equivalent  Occupational History  . Occupation: retired  Tobacco Use  . Smoking status: Former Smoker    Packs/day: 1.00    Years: 45.00    Pack years: 45.00    Types: Cigarettes    Quit date: 01/13/2016    Years since quitting: 4.1  . Smokeless tobacco: Never Used  Substance and Sexual Activity  . Alcohol use: No  . Drug use: No  . Sexual activity: Yes    Birth control/protection: Post-menopausal, Surgical  Other Topics Concern  . Not on file  Social History Narrative   Retired, goes to [employer]  Strain: Low Risk   . Difficulty of Paying Living Expenses: Not hard at all  Food Insecurity: No Food Insecurity  . Worried About Programme researcher, broadcasting/film/video in the Last Year: Never true  . Ran Out of Food in the Last Year: Never true  Transportation Needs: No Transportation Needs  . Lack of Transportation (Medical): No  . Lack of Transportation (Non-Medical): No  Physical Activity: Inactive  . Days of Exercise per Week: 0 days  . Minutes of Exercise per Session: 0 min  Stress: No Stress Concern Present  . Feeling of Stress : Not at all  Social Connections: Somewhat Isolated  . Frequency of Communication with Friends and  Family: Never  . Frequency of Social Gatherings with Friends and Family: Never  . Attends Religious Services: More than 4 times per year  . Active Member of Clubs or Organizations: No  . Attends Banker Meetings: Never  . Marital Status: Married    Outpatient Encounter Medications as of 03/10/2020  Medication Sig  . albuterol (PROVENTIL HFA;VENTOLIN HFA) 108 (90 Base) MCG/ACT inhaler Inhale 1 puff into the lungs every 6 (six) hours as needed for wheezing or shortness of breath.  Marland Kitchen aspirin EC 81 MG tablet Take 81 mg by mouth daily.  . budesonide-formoterol (SYMBICORT) 160-4.5 MCG/ACT inhaler Inhale 1 puff into the lungs 2 (two) times daily.  Marland Kitchen buPROPion (WELLBUTRIN XL) 300 MG 24 hr tablet TAKE 1 TABLET BY MOUTH EVERY DAY  . calcium carbonate (CALCIUM 600) 600 MG TABS tablet Take 600 mg by mouth 2 (two) times daily with a meal.  . cholecalciferol (VITAMIN D) 400 units TABS tablet Take 5,000 Units by mouth daily.  Marland Kitchen lisinopril (ZESTRIL) 30 MG tablet TAKE 1 TABLET BY MOUTH EVERY DAY  . simvastatin (ZOCOR) 40 MG tablet Take 1 tablet (40 mg total) by mouth daily.  Marland Kitchen triamcinolone cream (KENALOG) 0.1 % Apply 1 application topically 2 (two) times daily. For 7 days then stop and repeat as needed. (Patient not taking: Reported on 12/10/2019)   No facility-administered encounter medications on file as of 03/10/2020.    Activities of Daily Living In your present state of health, do you have any difficulty performing the following activities: 03/10/2020 12/10/2019  Hearing? N N  Vision? Y Y  Comment Needs a new eye glass prescription. pt wears glasses  Difficulty concentrating or making decisions? N N  Walking or climbing stairs? Y Y  Comment Due to SOB. -  Dressing or bathing? N N  Doing errands, shopping? N N  Preparing Food and eating ? N -  Using the Toilet? N -  In the past six months, have you accidently leaked urine? N -  Do you have problems with loss of bowel control? N -   Managing your Medications? N -  Managing your Finances? N -  Housekeeping or managing your Housekeeping? N -  Some recent data might be hidden    Patient Care Team: Maryella Shivers as PCP - General (Physician Assistant)    Assessment:   This is a routine wellness examination for [place].  Exercise Activities and Dietary recommendations Current Exercise Habits: The patient does not participate in regular exercise at present, Exercise limited by: None identified  Goals    . DIET - INCREASE WATER INTAKE      recommend drinking at least 6-8 glasses of water a day        Fall Risk: Fall Risk  03/10/2020 12/10/2019 08/01/2019 03/06/2019 02/28/2018  Falls in the past year? 0 0 1 0 No  Number falls in past yr: 0 0 0 - -  Injury with Fall? 0 0 1 - -  Risk for fall due to : - - - - -    FALL RISK PREVENTION PERTAINING TO THE HOME:  Any stairs in or around the home? Yes  If so, are there any without handrails? No   Home free of loose throw rugs in walkways, pet beds, electrical cords, etc? Yes  Adequate lighting in your home to reduce risk of falls? Yes   ASSISTIVE DEVICES UTILIZED TO PREVENT FALLS:  Life alert? No  Use of a cane, walker or w/c? No  Grab bars in the bathroom? Yes  Shower chair or bench in shower? No  Elevated toilet seat or a handicapped toilet? No    TIMED UP AND GO:  Was the test performed? No .    Depression Screen PHQ 2/9 Scores 03/10/2020 12/10/2019 08/01/2019 03/06/2019  PHQ - 2 Score 0 3 0 0  PHQ- 9 Score - 9 0 -     Cognitive Function     6CIT Screen 03/10/2020 03/06/2019 02/28/2018  What Year? 0 points 0 points 0 points  What month? 0 points 0 points 0 points  What time? 0 points 0 points 0 points  Count back from 20 0 points 0 points 0 points  Months in reverse 0 points 0 points 0 points  Repeat phrase 2 points 0 points 0 points  Total Score 2 0 0    Immunization History  Administered Date(s) Administered  . Influenza, High Dose  Seasonal PF 09/08/2018, 10/07/2019  . Influenza-Unspecified 09/20/2015, 10/01/2017  . Pneumococcal Conjugate-13 03/13/2014  . Pneumococcal-Unspecified 02/20/2013    Qualifies for Shingles Vaccine? Yes . Due for Shingrix. Pt has been advised to call insurance company to determine out of pocket expense. Advised may also receive vaccine at local pharmacy or Health Dept. Verbalized acceptance and understanding.  Tdap: Although this vaccine is not a covered service during a Wellness Exam, does the patient still wish to receive this vaccine today?  No . Advised may receive this vaccine at local pharmacy or Health Dept. Aware to provide a copy of the vaccination record if obtained from local pharmacy or Health Dept. Verbalized acceptance and understanding.  Flu Vaccine: Up to date  Pneumococcal Vaccine: Completed series  Screening Tests Health Maintenance  Topic Date Due  . TETANUS/TDAP  03/10/2021 (Originally 02/07/1965)  . MAMMOGRAM  10/07/2021  . Fecal DNA (Cologuard)  10/08/2022  . DEXA SCAN  10/07/2024  . INFLUENZA VACCINE  Completed  . Hepatitis C Screening  Completed  . PNA vac Low Risk Adult  Completed    Cancer Screenings:  Colorectal Screening: Cologuard completed 10/09/19. Repeat every 3 years.   Mammogram: Completed 11/09/19. Repeat every 1-2 years as advised.   Bone Density: Completed 10/08/19. Results reflect OSTEOPENIA. Repeat every 5 years.   Lung Cancer Screening: (Low Dose CT Chest recommended if Age 59-80 years, 30 pack-year currently smoking OR have quit w/in 15years) does qualify however completed scan 01/09/20. Repeat yearly or as advised.   Additional Screening:  Hepatitis C Screening: Up to date  Vision Screening: Recommended annual ophthalmology exams for early detection of glaucoma and other disorders of the eye.  Dental Screening: Recommended annual dental exams for proper oral hygiene  Community Resource Referral:  CRR required this visit?  No        Plan:  I have personally  reviewed and addressed the Medicare Annual Wellness questionnaire and have noted the following in the patient's chart:  A. Medical and social history B. Use of alcohol, tobacco or illicit drugs  C. Current medications and supplements D. Functional ability and status E.  Nutritional status F.  Physical activity G. Advance directives H. List of other physicians I.  Hospitalizations, surgeries, and ER visits in previous 12 months J.  Vitals K. Screenings such as hearing and vision if needed, cognitive and depression L. Referrals and appointments   In addition, I have reviewed and discussed with patient certain preventive protocols, quality metrics, and best practice recommendations. A written personalized care plan for preventive services as well as general preventive health recommendations were provided to patient. Nurse Health Advisor  Signed,    Izac Faulkenberry [place], [place]  4/[id number]/6759 Nurse Health Advisor   Nurse Notes: None.

## 2020-03-10 ENCOUNTER — Ambulatory Visit (INDEPENDENT_AMBULATORY_CARE_PROVIDER_SITE_OTHER): Payer: Medicare Other

## 2020-03-10 ENCOUNTER — Other Ambulatory Visit: Payer: Self-pay

## 2020-03-10 DIAGNOSIS — Z0000 Encounter for general adult medical examination without abnormal findings: Secondary | ICD-10-CM

## 2020-03-10 NOTE — Patient Instructions (Addendum)
Lindsey Webb , Thank you for taking time to come for your Medicare Wellness Visit. I appreciate your ongoing commitment to your health goals. Please review the following plan we discussed and let me know if I can assist you in the future.   Screening recommendations/referrals: Colonoscopy: Cologuard up to date, due 10/08/22 Mammogram: Up to date, due 10/2021 Bone Density: Up to date, due 09/2024 Recommended yearly ophthalmology/optometry visit for glaucoma screening and checkup Recommended yearly dental visit for hygiene and checkup  Vaccinations: Influenza vaccine: Up to date Pneumococcal vaccine: Completed series Tdap vaccine: Pt declines today.  Shingles vaccine: Pt declines today.     Advanced directives: Please bring a copy of your POA (Power of Attorney) and/or Living Will to your next appointment once completed. Advised this forms can be retrieved online or in our office.   Conditions/risks identified: Recommend to drink at least 6-8 8oz glasses of water per day.  Next appointment: 08/05/20 @ 9:00 AM with [name]    Preventive Care 65 Years and Older, Female Preventive care refers to lifestyle choices and visits with your health care provider that can promote health and wellness. What does preventive care include?  A yearly physical exam. This is also called an annual well check.  Dental exams once or twice a year.  Routine eye exams. Ask your health care provider how often you should have your eyes checked.  Personal lifestyle choices, including:  Daily care of your teeth and gums.  Regular physical activity.  Eating a healthy diet.  Avoiding tobacco and drug use.  Limiting alcohol use.  Practicing safe sex.  Taking low-dose aspirin every day.  Taking vitamin and mineral supplements as recommended by your health care provider. What happens during an annual well check? The services and screenings done by your health care provider during your annual well  check will depend on your age, overall health, lifestyle risk factors, and family history of disease. Counseling  Your health care provider may ask you questions about your:  Alcohol use.  Tobacco use.  Drug use.  Emotional well-being.  Home and relationship well-being.  Sexual activity.  Eating habits.  History of falls.  Memory and ability to understand (cognition).  Work and work Astronomer.  Reproductive health. Screening  You may have the following tests or measurements:  Height, weight, and BMI.  Blood pressure.  Lipid and cholesterol levels. These may be checked every 5 years, or more frequently if you are over 57 years old.  Skin check.  Lung cancer screening. You may have this screening every year starting at age 16 if you have a 30-pack-year history of smoking and currently smoke or have quit within the past 15 years.  Fecal occult blood test (FOBT) of the stool. You may have this test every year starting at age 33.  Flexible sigmoidoscopy or colonoscopy. You may have a sigmoidoscopy every 5 years or a colonoscopy every 10 years starting at age 29.  Hepatitis C blood test.  Hepatitis B blood test.  Sexually transmitted disease (STD) testing.  Diabetes screening. This is done by checking your blood sugar (glucose) after you have not eaten for a while (fasting). You may have this done every 1-3 years.  Bone density scan. This is done to screen for osteoporosis. You may have this done starting at age 70.  Mammogram. This may be done every 1-2 years. Talk to your health care provider about how often you should have regular mammograms. Talk with your health care  provider about your test results, treatment options, and if necessary, the need for more tests. Vaccines  Your health care provider may recommend certain vaccines, such as:  Influenza vaccine. This is recommended every year.  Tetanus, diphtheria, and acellular pertussis (Tdap, Td) vaccine. You  may need a Td booster every 10 years.  Zoster vaccine. You may need this after age 32.  Pneumococcal 13-valent conjugate (PCV13) vaccine. One dose is recommended after age 55.  Pneumococcal polysaccharide (PPSV23) vaccine. One dose is recommended after age 96. Talk to your health care provider about which screenings and vaccines you need and how often you need them. This information is not intended to replace advice given to you by your health care provider. Make sure you discuss any questions you have with your health care provider. Document Released: 01/02/2016 Document Revised: 08/25/2016 Document Reviewed: 10/07/2015 Elsevier Interactive Patient Education  2017 [place] Prevention in the Home Falls can cause injuries. They can happen to people of all ages. There are many things you can do to make your home safe and to help prevent falls. What can I do on the outside of my home?  Regularly fix the edges of walkways and driveways and fix any cracks.  Remove anything that might make you trip as you walk through a door, such as a raised step or threshold.  Trim any bushes or trees on the path to your home.  Use bright outdoor lighting.  Clear any walking paths of anything that might make someone trip, such as rocks or tools.  Regularly check to see if handrails are loose or broken. Make sure that both sides of any steps have handrails.  Any raised decks and porches should have guardrails on the edges.  Have any leaves, snow, or ice cleared regularly.  Use sand or salt on walking paths during winter.  Clean up any spills in your garage right away. This includes oil or grease spills. What can I do in the bathroom?  Use night lights.  Install grab bars by the toilet and in the tub and shower. Do not use towel bars as grab bars.  Use non-skid mats or decals in the tub or shower.  If you need to sit down in the shower, use a plastic, non-slip stool.  Keep the floor  dry. Clean up any water that spills on the floor as soon as it happens.  Remove soap buildup in the tub or shower regularly.  Attach bath mats securely with double-sided non-slip rug tape.  Do not have throw rugs and other things on the floor that can make you trip. What can I do in the bedroom?  Use night lights.  Make sure that you have a light by your bed that is easy to reach.  Do not use any sheets or blankets that are too big for your bed. They should not hang down onto the floor.  Have a firm chair that has side arms. You can use this for support while you get dressed.  Do not have throw rugs and other things on the floor that can make you trip. What can I do in the kitchen?  Clean up any spills right away.  Avoid walking on wet floors.  Keep items that you use a lot in easy-to-reach places.  If you need to reach something above you, use a strong step stool that has a grab bar.  Keep electrical cords out of the way.  Do not use floor polish  or wax that makes floors slippery. If you must use wax, use non-skid floor wax.  Do not have throw rugs and other things on the floor that can make you trip. What can I do with my stairs?  Do not leave any items on the stairs.  Make sure that there are handrails on both sides of the stairs and use them. Fix handrails that are broken or loose. Make sure that handrails are as long as the stairways.  Check any carpeting to make sure that it is firmly attached to the stairs. Fix any carpet that is loose or worn.  Avoid having throw rugs at the top or bottom of the stairs. If you do have throw rugs, attach them to the floor with carpet tape.  Make sure that you have a light switch at the top of the stairs and the bottom of the stairs. If you do not have them, ask someone to add them for you. What else can I do to help prevent falls?  Wear shoes that:  Do not have high heels.  Have rubber bottoms.  Are comfortable and fit you  well.  Are closed at the toe. Do not wear sandals.  If you use a stepladder:  Make sure that it is fully opened. Do not climb a closed stepladder.  Make sure that both sides of the stepladder are locked into place.  Ask someone to hold it for you, if possible.  Clearly mark and make sure that you can see:  Any grab bars or handrails.  First and last steps.  Where the edge of each step is.  Use tools that help you move around (mobility aids) if they are needed. These include:  Canes.  Walkers.  Scooters.  Crutches.  Turn on the lights when you go into a dark area. Replace any light bulbs as soon as they burn out.  Set up your furniture so you have a clear path. Avoid moving your furniture around.  If any of your floors are uneven, fix them.  If there are any pets around you, be aware of where they are.  Review your medicines with your doctor. Some medicines can make you feel dizzy. This can increase your chance of falling. Ask your doctor what other things that you can do to help prevent falls. This information is not intended to replace advice given to you by your health care provider. Make sure you discuss any questions you have with your health care provider. Document Released: 10/02/2009 Document Revised: 05/13/2016 Document Reviewed: 01/10/2015 Elsevier Interactive Patient Education  2017 [employer].

## 2020-08-01 ENCOUNTER — Encounter: Payer: Medicare Other | Admitting: Nurse Practitioner

## 2020-08-05 ENCOUNTER — Encounter: Payer: Self-pay | Admitting: Physician Assistant

## 2020-08-05 ENCOUNTER — Other Ambulatory Visit: Payer: Self-pay | Admitting: Physician Assistant

## 2020-08-05 DIAGNOSIS — I10 Essential (primary) hypertension: Secondary | ICD-10-CM

## 2020-08-06 ENCOUNTER — Other Ambulatory Visit: Payer: Self-pay | Admitting: Physician Assistant

## 2020-08-06 DIAGNOSIS — E785 Hyperlipidemia, unspecified: Secondary | ICD-10-CM

## 2020-08-06 NOTE — Telephone Encounter (Signed)
Requested Prescriptions  Pending Prescriptions Disp Refills  . simvastatin (ZOCOR) 40 MG tablet [Pharmacy Med Name: SIMVASTATIN 40 MG TABLET] 30 tablet 0    Sig: TAKE 1 TABLET BY MOUTH EVERY DAY     Cardiovascular:  Antilipid - Statins Failed - 08/06/2020  1:23 AM      Failed - Total Cholesterol in normal range and within 360 days    Cholesterol  Date Value Ref Range Status  08/01/2019 148 <200 mg/dL Final         Failed - LDL in normal range and within 360 days    LDL Cholesterol (Calc)  Date Value Ref Range Status  08/01/2019 71 mg/dL (calc) Final    Comment:    Reference range: <100 . Desirable range <100 mg/dL for primary prevention;   <70 mg/dL for patients with CHD or diabetic patients  with > or = 2 CHD risk factors. Marland Kitchen LDL-C is now calculated using the Martin-Hopkins  calculation, which is a validated novel method providing  better accuracy than the Friedewald equation in the  estimation of LDL-C.  Horald Pollen et al. [place] Ahr. 9381;829(93): 2061-2068  (http://education.[facility].com/faq/FAQ164)          Failed - HDL in normal range and within 360 days    HDL  Date Value Ref Range Status  08/01/2019 58 > OR = 50 mg/dL Final         Failed - Triglycerides in normal range and within 360 days    Triglycerides  Date Value Ref Range Status  08/01/2019 105 <150 mg/dL Final         Passed - Patient is not pregnant      Passed - Valid encounter within last 12 months    Recent Outpatient Visits          8 months ago Depression, unspecified depression type   [facility] [place], Lavella Hammock, PA-C   1 year ago Encounter for annual physical exam   [facility] Galen Manila, NP   1 year ago Acute non-recurrent pansinusitis   [facility] Galen Manila, NP   2 years ago Acute bronchitis with COPD [facility])   [facility] Galen Manila, NP   2 years ago Chronic bronchitis, unspecified  chronic bronchitis type [facility])   [facility], Alison Stalling, NP              Reminder for patient to schedule an appointment for an office visit and labs.

## 2020-08-19 ENCOUNTER — Encounter: Payer: Self-pay | Admitting: Physician Assistant

## 2020-10-28 ENCOUNTER — Other Ambulatory Visit: Payer: Self-pay | Admitting: Physician Assistant

## 2020-10-28 ENCOUNTER — Telehealth: Payer: Self-pay | Admitting: Physician Assistant

## 2020-10-28 DIAGNOSIS — E785 Hyperlipidemia, unspecified: Secondary | ICD-10-CM

## 2020-10-28 NOTE — Telephone Encounter (Signed)
Patient was advised and scheduled appointment on 11/27/2020.

## 2020-10-28 NOTE — Telephone Encounter (Signed)
12/10/2019 was last visit for this patient. She will be due in ~ 1 month for CPE and follow up. Can we please schedule. Will need to have follow up in order to continue filling medications.

## 2020-10-28 NOTE — Telephone Encounter (Signed)
Requested medications are due for refill today?  Yes  Requested medications are on active medication list?  Yes  Last Refill:   08/06/2020  # 90 with no refills. Reminder included for patient to schedule an appointment for office visit and labs.    Future visit scheduled?  No   Notes to Clinic:    Medication failed Rx refill protocol due to no labs within the past 360 days.  Last labs were performed on 08/01/2019.

## 2020-11-25 ENCOUNTER — Other Ambulatory Visit: Payer: Self-pay | Admitting: Physician Assistant

## 2020-11-25 DIAGNOSIS — E785 Hyperlipidemia, unspecified: Secondary | ICD-10-CM

## 2020-11-27 ENCOUNTER — Encounter: Payer: Self-pay | Admitting: Physician Assistant

## 2020-11-27 ENCOUNTER — Other Ambulatory Visit: Payer: Self-pay

## 2020-11-27 ENCOUNTER — Ambulatory Visit
Admission: RE | Admit: 2020-11-27 | Discharge: 2020-11-27 | Disposition: A | Discharge status: Home / Self Care | Payer: Medicare Other | Attending: Physician Assistant | Admitting: Physician Assistant

## 2020-11-27 ENCOUNTER — Ambulatory Visit (INDEPENDENT_AMBULATORY_CARE_PROVIDER_SITE_OTHER): Payer: Medicare Other | Admitting: Physician Assistant

## 2020-11-27 ENCOUNTER — Ambulatory Visit
Admission: RE | Admit: 2020-11-27 | Discharge: 2020-11-27 | Disposition: A | Discharge status: Home / Self Care | Payer: Medicare Other | Source: Ambulatory Visit | Attending: Physician Assistant | Admitting: Physician Assistant

## 2020-11-27 VITALS — BP 148/79 | HR 90 | Temp 98.2°F | Resp 16 | Ht 62.0 in | Wt 182.0 lb

## 2020-11-27 DIAGNOSIS — M8589 Other specified disorders of bone density and structure, multiple sites: Secondary | ICD-10-CM

## 2020-11-27 DIAGNOSIS — Z23 Encounter for immunization: Secondary | ICD-10-CM

## 2020-11-27 DIAGNOSIS — M79672 Pain in left foot: Secondary | ICD-10-CM | POA: Insufficient documentation

## 2020-11-27 DIAGNOSIS — I10 Essential (primary) hypertension: Secondary | ICD-10-CM | POA: Diagnosis not present

## 2020-11-27 DIAGNOSIS — E785 Hyperlipidemia, unspecified: Secondary | ICD-10-CM

## 2020-11-27 DIAGNOSIS — F32A Depression, unspecified: Secondary | ICD-10-CM | POA: Diagnosis not present

## 2020-11-27 DIAGNOSIS — F324 Major depressive disorder, single episode, in partial remission: Secondary | ICD-10-CM

## 2020-11-27 DIAGNOSIS — J42 Unspecified chronic bronchitis: Secondary | ICD-10-CM

## 2020-11-27 DIAGNOSIS — Z1231 Encounter for screening mammogram for malignant neoplasm of breast: Secondary | ICD-10-CM

## 2020-11-27 DIAGNOSIS — Z0000 Encounter for general adult medical examination without abnormal findings: Secondary | ICD-10-CM

## 2020-11-27 MED ORDER — SERTRALINE HCL 50 MG PO TABS: 50.0000 mg | ORAL_TABLET | Freq: Every day | ORAL | 1 refills | Status: DC

## 2020-11-27 NOTE — Progress Notes (Signed)
Complete physical exam   Patient: Lindsey Webb   DOB: [birth year]   74 y.o. Female  MRN: [id number] Visit Date: 11/27/2020  Today's healthcare provider: Trey SailorsAdriana M Aadhav Uhlig, PA-C   Chief Complaint  Patient presents with  . Annual Exam  . Foot Pain   Subjective    Lindsey DrownLinda Webb is a 74 y.o. female who presents today for a complete physical exam.  She reports consuming a general diet. The patient does not participate in regular exercise at present. She generally feels fairly well. She reports sleeping fairly well. She does have additional problems to discuss today.   Hypertension, follow-up  BP Readings from Last 3 Encounters:  11/27/20 (!) 148/79  12/10/19 134/86  08/01/19 (!) 121/55   Wt Readings from Last 3 Encounters:  11/27/20 182 lb (82.6 kg)  12/10/19 183 lb 6.4 oz (83.2 kg)  08/01/19 178 lb 3.2 oz (80.8 kg)     She was last seen for hypertension 1 years ago.  BP at that visit was 134/86. Management since that visit includes no changes.  She reports good compliance with treatment. She is not having side effects.  She is following a Regular diet. She is not exercising. She does not smoke.  Use of agents associated with hypertension: none.   Outside blood pressures are not being checked. Symptoms: No chest pain No chest pressure  No palpitations No syncope  No dyspnea No orthopnea  No paroxysmal nocturnal dyspnea No lower extremity edema   Pertinent labs: Lab Results  Component Value Date   CHOL 148 08/01/2019   HDL 58 08/01/2019   LDLCALC 71 08/01/2019   TRIG 105 08/01/2019   CHOLHDL 2.6 08/01/2019   Lab Results  Component Value Date   NA 141 08/01/2019   K 4.7 08/01/2019   CREATININE 0.71 08/01/2019   GFRNONAA 86 08/01/2019   GFRAA 99 08/01/2019   GLUCOSE 94 08/01/2019     The 10-year ASCVD risk score Lindsey Webb(Goff DC Jr., et al., 2013) is: 18.9%   Lipid/Cholesterol, Follow-up  Last lipid panel Other pertinent labs  Lab Results  Component Value Date    CHOL 148 08/01/2019   HDL 58 08/01/2019   LDLCALC 71 08/01/2019   TRIG 105 08/01/2019   CHOLHDL 2.6 08/01/2019   Lab Results  Component Value Date   ALT 18 08/01/2019   AST 19 08/01/2019   PLT 260 08/01/2019   TSH 0.80 08/01/2019     She was last seen for this 1 years ago.  Management since that visit includes no changes.  She reports good compliance with treatment. She is not having side effects.   Depression, Follow-up  She  was last seen for this 1 years ago. Changes made at last visit include no changes. However, patient reports that she stopped taking Wellbutrin. She reports that she could not tell that it was helping. She does report some low motivation. She used to take zoloft previously for anxiety which she reports working well. Cannot recall why she stopped zoloft.   [facility] 11/27/2020 03/10/2020 12/10/2019  Decreased Interest 2 0 2  Down, Depressed, Hopeless 1 0 1  PHQ - 2 Score 3 0 3  Altered sleeping 3 - 2  Tired, decreased energy 1 - 1  Change in appetite 1 - 2  Feeling bad or failure about yourself  1 - 0  Trouble concentrating 0 - 1  Moving slowly or fidgety/restless 0 - 0  Suicidal thoughts 0 - 0  PHQ-9 Score 9 - 9  Difficult doing work/chores Somewhat difficult - Not difficult at all    Foot pain Patient reports that she has had pain in her left foot X several months. She denies any injuries. She reports that she can not bear weight on it. Symptoms are not relived with ibuprofen or tylenol. She reports the foot is swollen and hurts fairly consistently.   Past Medical History:  Diagnosis Date  . Anxiety   . Arthritis    osteoarthritis  . COPD (chronic obstructive pulmonary disease) (HCC)   . Depression   . History of cervical cancer   . Hyperlipidemia   . Hypertension   . Osteopenia   . Sleep apnea   . Vitamin D deficiency    Past Surgical History:  Procedure Laterality Date  . ABDOMINAL HYSTERECTOMY  1982  .  CHOLECYSTECTOMY  1981  . TONSILLECTOMY  1962   Social History   Socioeconomic History  . Marital status: Married    Spouse name: Not on file  . Number of children: 2  . Years of education: Not on file  . Highest education level: GED or equivalent  Occupational History  . Occupation: retired  Tobacco Use  . Smoking status: Former Smoker    Packs/day: 1.00    Years: 45.00    Pack years: 45.00    Types: Cigarettes    Quit date: 01/13/2016    Years since quitting: 4.8  . Smokeless tobacco: Never Used  Vaping Use  . Vaping Use: Never used  Substance and Sexual Activity  . Alcohol use: No  . Drug use: No  . Sexual activity: Yes    Birth control/protection: Post-menopausal, Surgical  Other Topics Concern  . Not on file  Social History Narrative   Retired, goes to [employer] Corporate investment banker Strain: Low Risk   . Difficulty of Paying Living Expenses: Not hard at all  Food Insecurity: No Food Insecurity  . Worried About Programme researcher, broadcasting/film/video in the Last Year: Never true  . Ran Out of Food in the Last Year: Never true  Transportation Needs: No Transportation Needs  . Lack of Transportation (Medical): No  . Lack of Transportation (Non-Medical): No  Physical Activity: Inactive  . Days of Exercise per Week: 0 days  . Minutes of Exercise per Session: 0 min  Stress: No Stress Concern Present  . Feeling of Stress : Not at all  Social Connections: Moderately Isolated  . Frequency of Communication with Friends and Family: Never  . Frequency of Social Gatherings with Friends and Family: Never  . Attends Religious Services: More than 4 times per year  . Active Member of Clubs or Organizations: No  . Attends Banker Meetings: Never  . Marital Status: Married  Catering manager Violence: Not At Risk  . Fear of Current or Ex-Partner: No  . Emotionally Abused: No  . Physically Abused: No  . Sexually Abused: No   Family Status  Relation  Name Status  . Father  Deceased at age 63  . Mother  Deceased at age 14  . PGM  (Not Specified)   Family History  Problem Relation Age of Onset  . Prostate cancer Father   . Brain cancer Father   . Colon polyps Father   . Brain cancer Mother   . Brain cancer Paternal Grandmother    Allergies  Allergen Reactions  . Latex Itching and Rash  . Sodium  Hypochlorite Itching and Rash    Clorox    Patient Care Team: Maryella Shivers as PCP - General (Physician Assistant)   Medications: Outpatient Medications Prior to Visit  Medication Sig  . albuterol (PROVENTIL HFA;VENTOLIN HFA) 108 (90 Base) MCG/ACT inhaler Inhale 1 puff into the lungs every 6 (six) hours as needed for wheezing or shortness of breath.  Marland Kitchen aspirin EC 81 MG tablet Take 81 mg by mouth daily.  . budesonide-formoterol (SYMBICORT) 160-4.5 MCG/ACT inhaler Inhale 1 puff into the lungs 2 (two) times daily.  . calcium carbonate (OS-CAL) 600 MG TABS tablet Take 600 mg by mouth 2 (two) times daily with a meal.  . cholecalciferol (VITAMIN D) 400 units TABS tablet Take 5,000 Units by mouth daily.  Marland Kitchen lisinopril (ZESTRIL) 30 MG tablet TAKE 1 TABLET BY MOUTH EVERY DAY  . simvastatin (ZOCOR) 40 MG tablet TAKE 1 TABLET (40 MG TOTAL) BY MOUTH DAILY. PLEASE SCHEDULE AN OFFICE VISIT BEFORE ANYMORE REFILLS.  . [DISCONTINUED] buPROPion (WELLBUTRIN XL) 300 MG 24 hr tablet TAKE 1 TABLET BY MOUTH EVERY DAY  . [DISCONTINUED] triamcinolone cream (KENALOG) 0.1 % Apply 1 application topically 2 (two) times daily. For 7 days then stop and repeat as needed.   No facility-administered medications prior to visit.    Review of Systems  Constitutional: Negative.   HENT: Negative.   Eyes: Negative.   Respiratory: Negative.   Cardiovascular: Negative.   Gastrointestinal: Negative.   Endocrine: Negative.   Genitourinary: Negative.   Musculoskeletal: Positive for arthralgias.  Skin: Negative.   Allergic/Immunologic: Negative.   Neurological:  Negative.   Hematological: Negative.   Psychiatric/Behavioral: Negative.       Objective    BP (!) 148/79   Pulse 90   Temp 98.2 F (36.8 C)   Resp 16   Ht 5\' 2"  (1.575 m)   Wt 182 lb (82.6 kg)   BMI 33.29 kg/m    Physical Exam Constitutional:      Appearance: Normal appearance.  HENT:     Right Ear: Tympanic membrane and ear canal normal.     Left Ear: Tympanic membrane and ear canal normal.  Cardiovascular:     Rate and Rhythm: Normal rate and regular rhythm.     Heart sounds: Normal heart sounds.  Pulmonary:     Effort: Pulmonary effort is normal.     Breath sounds: Normal breath sounds.  Abdominal:     General: Bowel sounds are normal.     Palpations: Abdomen is soft.  Musculoskeletal:     Cervical back: Normal range of motion and neck supple.  Lymphadenopathy:     Cervical: No cervical adenopathy.  Skin:    General: Skin is warm and dry.  Neurological:     Mental Status: She is alert and oriented to person, place, and time. Mental status is at baseline.  Psychiatric:        Mood and Affect: Mood normal.        Behavior: Behavior normal.       Last depression screening scores PHQ 2/9 Scores 11/27/2020 03/10/2020 12/10/2019  PHQ - 2 Score 3 0 3  PHQ- 9 Score 9 - 9   Last fall risk screening Fall Risk  11/27/2020  Falls in the past year? 0  Number falls in past yr: 0  Injury with Fall? 0  Risk for fall due to : No Fall Risks  Follow up Falls evaluation completed   Last Audit-C alcohol use screening Alcohol Use Disorder  Test (AUDIT) 11/27/2020  1. How often do you have a drink containing alcohol? 1  2. How many drinks containing alcohol do you have on a typical day when you are drinking? 0  3. How often do you have six or more drinks on one occasion? 0  AUDIT-C Score 1  Alcohol Brief Interventions/Follow-up AUDIT Score <7 follow-up not indicated   A score of 3 or more in women, and 4 or more in men indicates increased risk for alcohol abuse, EXCEPT  if all of the points are from question 1   COPD, Follow up  She was last seen for this 12 months ago. Changes made include symbicort.   She reports fair compliance with treatment. She is not having side effects.  she uses rescue inhaler 3 days per months. She IS experiencing cough. She is NOT experiencing dyspnea or wheezing. she reports breathing is Improved.  Pulmonary Functions Testing Results:  No results found for: FEV1, FVC, FEV1FVC, TLC  -----------------------------------------------------------------------------------------   No results found for any visits on 11/27/20.  Assessment & Plan    Routine Health Maintenance and Physical Exam  Exercise Activities and Dietary recommendations Goals    . DIET - INCREASE WATER INTAKE      recommend drinking at least 6-8 glasses of water a day        Immunization History  Administered Date(s) Administered  . Fluad Quad(high Dose 65+) 11/27/2020  . Influenza, High Dose Seasonal PF 09/08/2018, 10/07/2019  . Influenza-Unspecified 09/20/2015, 10/01/2017  . Janssen (J&J) SARS-COV-2 Vaccination 08/19/2020  . Pneumococcal Conjugate-13 03/13/2014  . Pneumococcal-Unspecified 02/20/2013    Health Maintenance  Topic Date Due  . COVID-19 Vaccine (2 - Booster for Janssen series) 10/14/2020  . TETANUS/TDAP  03/10/2021 (Originally 02/07/1965)  . MAMMOGRAM  10/07/2021  . Fecal DNA (Cologuard)  10/08/2022  . DEXA SCAN  10/07/2024  . INFLUENZA VACCINE  Completed  . Hepatitis C Screening  Completed  . PNA vac Low Risk Adult  Completed    Discussed health benefits of physical activity, and encouraged her to engage in regular exercise appropriate for her age and condition.  1. Annual physical exam  - TSH - Lipid panel - Comprehensive metabolic panel - CBC with Differential/Platelet  2. Encounter for screening mammogram for malignant neoplasm of breast  Due next year.  3. Primary hypertension  Continue current  medications.   - TSH - Lipid panel - Comprehensive metabolic panel - CBC with Differential/Platelet  4. Osteopenia of multiple sites   5. Depression, unspecified depression type  - sertraline (ZOLOFT) 50 MG tablet; Take 1 tablet (50 mg total) by mouth daily.  Dispense: 90 tablet; Refill: 1  6. Hyperlipidemia, unspecified hyperlipidemia type  Continue statin.  7. Major depressive disorder with single episode, in partial remission (HCC)  Will change wellbutrin to zoloft 50 mg QD, which she has taken previously.   8. Left foot pain  Fracture vs soft tissue injury. Start with xrays as below.   - DG Foot Complete Left; Future - DG Ankle Complete Left; Future  9. Chronic bronchitis, unspecified chronic bronchitis type (HCC)  She has stopped symbicort due to not needing it. Continue albuterol.   10. Need for influenza vaccination  - Flu Vaccine QUAD High Dose(Fluad)   Return in about 2 months (around 01/28/2021) for DEPRESSION.     ITrey Sailors, PA-C, have reviewed all documentation for this visit. The documentation on 11/27/20 for the exam, diagnosis, procedures, and orders are all accurate and  complete.  The entirety of the information documented in the History of Present Illness, Review of Systems and Physical Exam were personally obtained by me. Portions of this information were initially documented by Anson Oregon, CMA and reviewed by me for thoroughness and accuracy.      Maryella Shivers  [facility] [phone] (phone) [phone] (fax)  [facility]

## 2020-11-27 NOTE — Patient Instructions (Signed)
Tylenol 1000mg three times a day

## 2020-11-28 ENCOUNTER — Encounter: Payer: Self-pay | Admitting: Physician Assistant

## 2020-11-28 DIAGNOSIS — S82892A Other fracture of left lower leg, initial encounter for closed fracture: Secondary | ICD-10-CM

## 2020-11-28 LAB — COMPREHENSIVE METABOLIC PANEL
ALT: 23 IU/L (ref 0–32)
AST: 22 IU/L (ref 0–40)
Albumin/Globulin Ratio: 1.9 (ref 1.2–2.2)
Albumin: 4.6 g/dL (ref 3.7–4.7)
Alkaline Phosphatase: 122 IU/L — ABNORMAL HIGH (ref 44–121)
BUN/Creatinine Ratio: 21 (ref 12–28)
BUN: 14 mg/dL (ref 8–27)
Bilirubin Total: 0.7 mg/dL (ref 0.0–1.2)
CO2: 25 mmol/L (ref 20–29)
Calcium: 9.7 mg/dL (ref 8.7–10.3)
Chloride: 102 mmol/L (ref 96–106)
Creatinine, Ser: 0.66 mg/dL (ref 0.57–1.00)
GFR calc Af Amer: 102 mL/min/{1.73_m2} (ref >59)
GFR calc non Af Amer: 89 mL/min/{1.73_m2} (ref >59)
Globulin, Total: 2.4 g/dL (ref 1.5–4.5)
Glucose: 107 mg/dL — ABNORMAL HIGH (ref 65–99)
Potassium: 4.6 mmol/L (ref 3.5–5.2)
Sodium: 140 mmol/L (ref 134–144)
Total Protein: 7 g/dL (ref 6.0–8.5)

## 2020-11-28 LAB — CBC WITH DIFFERENTIAL/PLATELET
Basophils Absolute: 0.1 10*3/uL (ref 0.0–0.2)
Basos: 1 %
EOS (ABSOLUTE): 0.2 10*3/uL (ref 0.0–0.4)
Eos: 3 %
Hematocrit: 42.6 % (ref 34.0–46.6)
Hemoglobin: 14.8 g/dL (ref 11.1–15.9)
Immature Grans (Abs): 0 10*3/uL (ref 0.0–0.1)
Immature Granulocytes: 0 %
Lymphocytes Absolute: 2.2 10*3/uL (ref 0.7–3.1)
Lymphs: 35 %
MCH: 31.2 pg (ref 26.6–33.0)
MCHC: 34.7 g/dL (ref 31.5–35.7)
MCV: 90 fL (ref 79–97)
Monocytes Absolute: 0.5 10*3/uL (ref 0.1–0.9)
Monocytes: 7 %
Neutrophils Absolute: 3.5 10*3/uL (ref 1.4–7.0)
Neutrophils: 54 %
Platelets: 276 10*3/uL (ref 150–450)
RBC: 4.75 x10E6/uL (ref 3.77–5.28)
RDW: 11.9 % (ref 11.7–15.4)
WBC: 6.5 10*3/uL (ref 3.4–10.8)

## 2020-11-28 LAB — LIPID PANEL
Chol/HDL Ratio: 3 ratio (ref 0.0–4.4)
Cholesterol, Total: 174 mg/dL (ref 100–199)
HDL: 58 mg/dL (ref >39)
LDL Chol Calc (NIH): 97 mg/dL (ref 0–99)
Triglycerides: 105 mg/dL (ref 0–149)
VLDL Cholesterol Cal: 19 mg/dL (ref 5–40)

## 2020-11-28 LAB — TSH: TSH: 0.848 u[IU]/mL (ref 0.450–4.500)

## 2020-12-19 ENCOUNTER — Other Ambulatory Visit: Payer: Self-pay | Admitting: Physician Assistant

## 2020-12-19 DIAGNOSIS — E785 Hyperlipidemia, unspecified: Secondary | ICD-10-CM

## 2021-01-19 ENCOUNTER — Other Ambulatory Visit: Payer: Self-pay | Admitting: Physician Assistant

## 2021-01-19 DIAGNOSIS — E785 Hyperlipidemia, unspecified: Secondary | ICD-10-CM

## 2021-01-22 ENCOUNTER — Telehealth: Payer: Self-pay | Admitting: *Deleted

## 2021-01-22 NOTE — Telephone Encounter (Signed)
Attempted to contact and schedule lung screening scan. Message left for patient to call back to schedule. 

## 2021-01-25 IMAGING — MG DIGITAL SCREENING BILAT W/ TOMO W/ CAD
6 of 12 series · 6 of 36 positions shown · non-contrast
Comparison: Previous exam(s).

CLINICAL DATA: Screening.

EXAM:
DIGITAL SCREENING BILATERAL MAMMOGRAM WITH TOMO AND CAD

[R CC synth-2D]
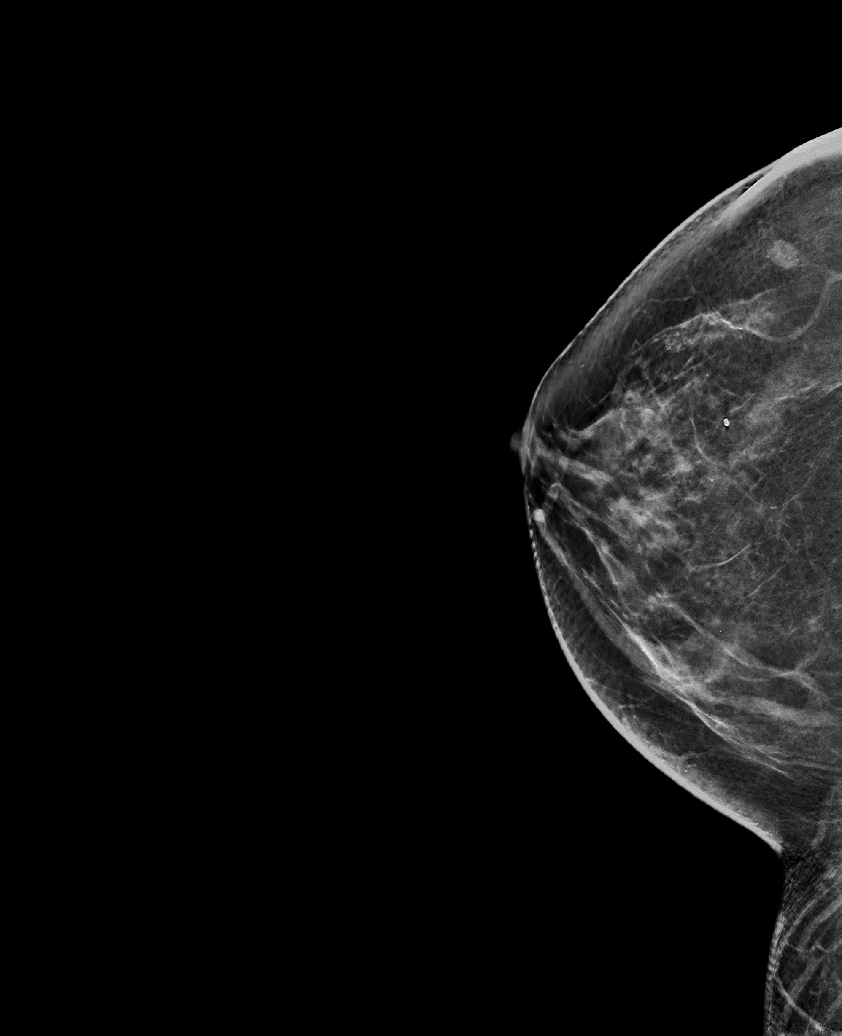

[L MLO synth-2D]
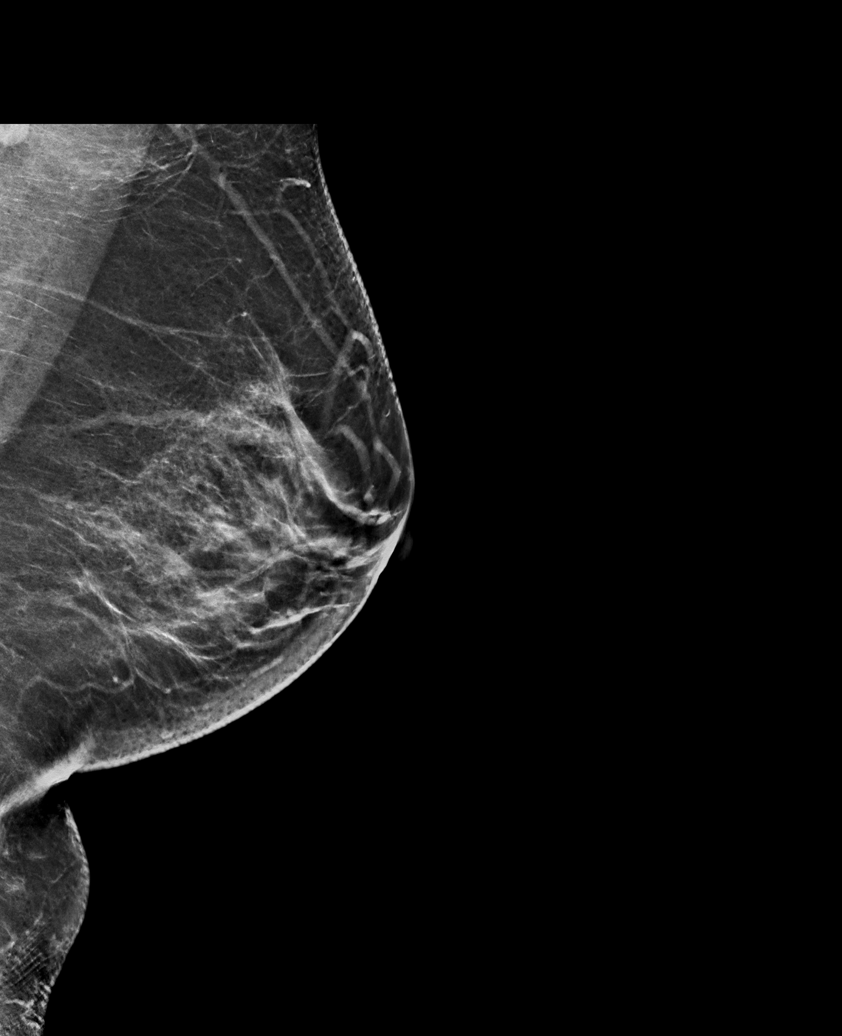

[L XCCL synth-2D]
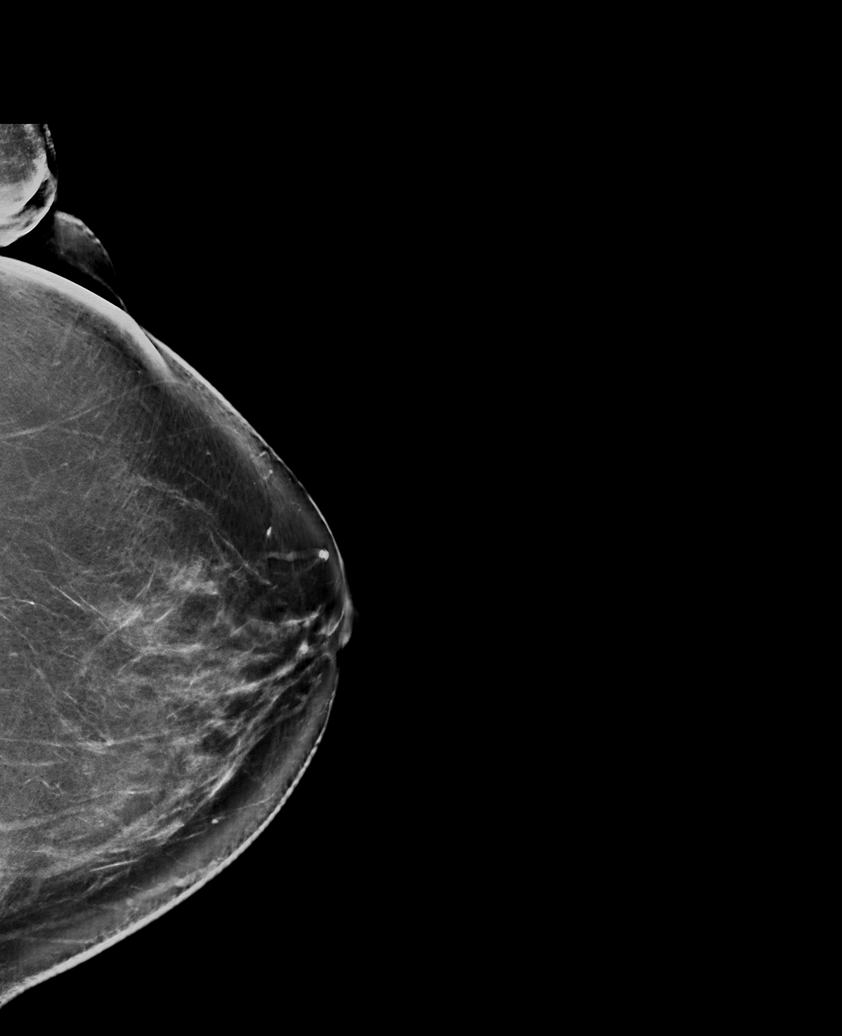

[R MLO synth-2D]
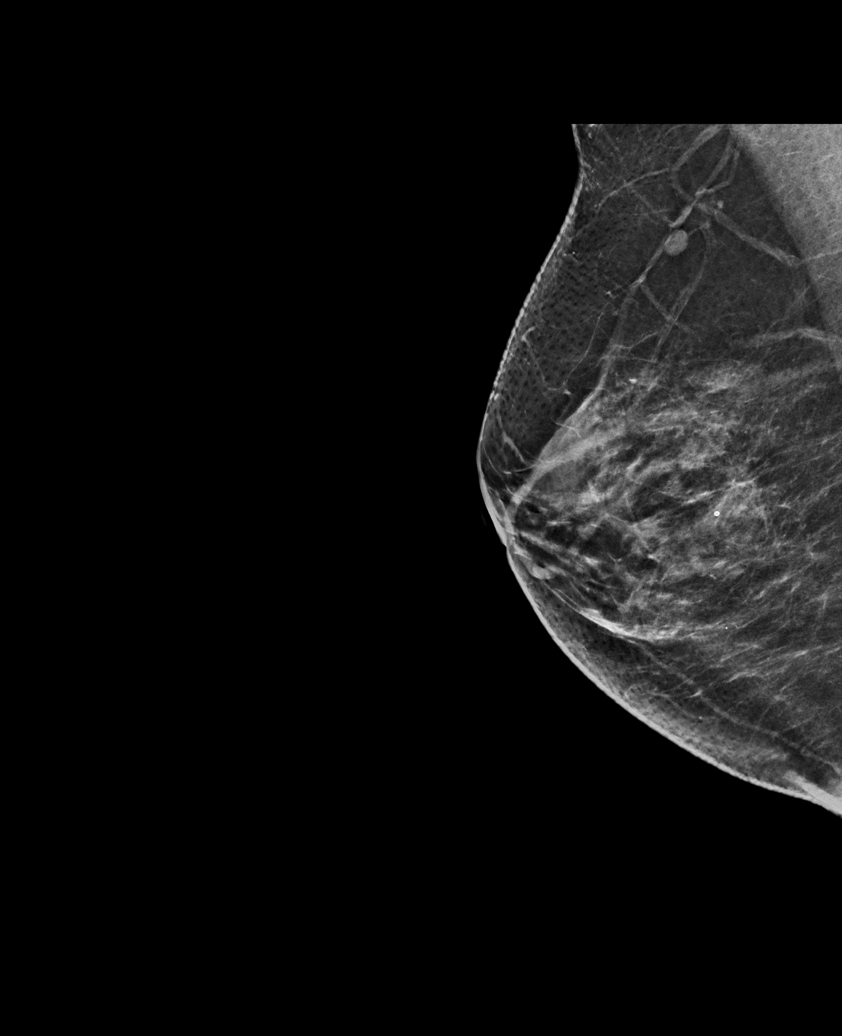

[R XCCL synth-2D]
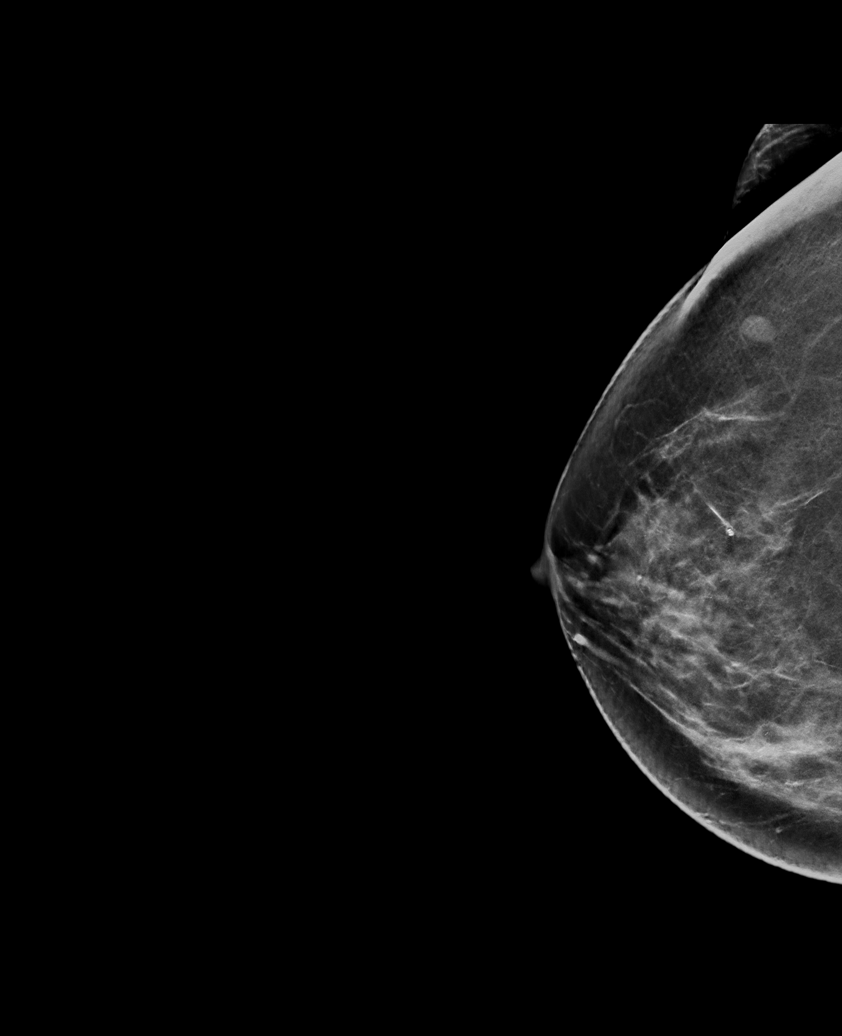

[L CC synth-2D]
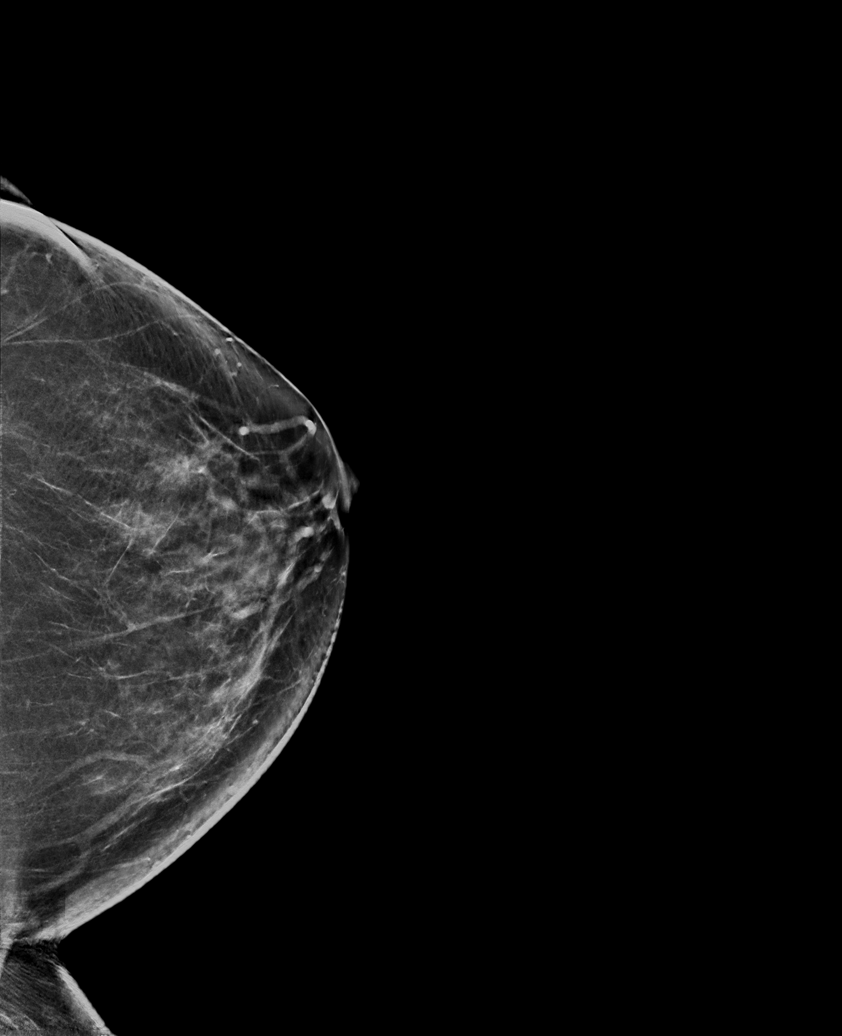

[6 of 36 positions shown; findings below may reference images not displayed]

ACR Breast Density Category b: There are scattered areas of
fibroglandular density.
FINDINGS: There are no findings suspicious for malignancy. Images were
processed with CAD.
IMPRESSION: No mammographic evidence of malignancy. A result letter of this
screening mammogram will be mailed directly to the patient.

RECOMMENDATION:
Screening mammogram in one year. (Code:[id number])

BI-RADS CATEGORY  1: Negative.

## 2021-01-28 ENCOUNTER — Ambulatory Visit: Payer: Self-pay | Admitting: Physician Assistant

## 2021-01-31 ENCOUNTER — Other Ambulatory Visit: Payer: Self-pay | Admitting: Physician Assistant

## 2021-01-31 DIAGNOSIS — I10 Essential (primary) hypertension: Secondary | ICD-10-CM

## 2021-02-03 ENCOUNTER — Other Ambulatory Visit: Payer: Self-pay | Admitting: *Deleted

## 2021-02-03 DIAGNOSIS — Z87891 Personal history of nicotine dependence: Secondary | ICD-10-CM

## 2021-02-03 DIAGNOSIS — Z122 Encounter for screening for malignant neoplasm of respiratory organs: Secondary | ICD-10-CM

## 2021-02-03 NOTE — Progress Notes (Signed)
Contacted and scheduled for annual lung screening scan. Patient is a former smoker, quit 2017, 45 pack year.

## 2021-02-16 ENCOUNTER — Ambulatory Visit: Admission: RE | Admit: 2021-02-16 | Payer: Medicare Other | Source: Ambulatory Visit

## 2021-03-04 ENCOUNTER — Other Ambulatory Visit: Payer: Self-pay

## 2021-03-04 ENCOUNTER — Ambulatory Visit
Admission: RE | Admit: 2021-03-04 | Discharge: 2021-03-04 | Disposition: A | Discharge status: Home / Self Care | Payer: Medicare Other | Source: Ambulatory Visit | Attending: Nurse Practitioner | Admitting: Nurse Practitioner

## 2021-03-04 DIAGNOSIS — Z122 Encounter for screening for malignant neoplasm of respiratory organs: Secondary | ICD-10-CM | POA: Insufficient documentation

## 2021-03-04 DIAGNOSIS — Z87891 Personal history of nicotine dependence: Secondary | ICD-10-CM | POA: Insufficient documentation

## 2021-03-12 ENCOUNTER — Encounter: Payer: Self-pay | Admitting: *Deleted

## 2021-03-25 ENCOUNTER — Emergency Department
Admission: EM | Admit: 2021-03-25 | Discharge: 2021-03-25 | Disposition: A | Discharge status: Home / Self Care | Payer: Medicare Other | Attending: Emergency Medicine | Admitting: Emergency Medicine

## 2021-03-25 ENCOUNTER — Other Ambulatory Visit: Payer: Self-pay

## 2021-03-25 DIAGNOSIS — Z8541 Personal history of malignant neoplasm of cervix uteri: Secondary | ICD-10-CM | POA: Insufficient documentation

## 2021-03-25 DIAGNOSIS — Z79899 Other long term (current) drug therapy: Secondary | ICD-10-CM | POA: Diagnosis not present

## 2021-03-25 DIAGNOSIS — Z87891 Personal history of nicotine dependence: Secondary | ICD-10-CM | POA: Diagnosis not present

## 2021-03-25 DIAGNOSIS — Z7951 Long term (current) use of inhaled steroids: Secondary | ICD-10-CM | POA: Diagnosis not present

## 2021-03-25 DIAGNOSIS — R112 Nausea with vomiting, unspecified: Secondary | ICD-10-CM | POA: Diagnosis present

## 2021-03-25 DIAGNOSIS — J449 Chronic obstructive pulmonary disease, unspecified: Secondary | ICD-10-CM | POA: Diagnosis not present

## 2021-03-25 DIAGNOSIS — R197 Diarrhea, unspecified: Secondary | ICD-10-CM

## 2021-03-25 DIAGNOSIS — E860 Dehydration: Secondary | ICD-10-CM

## 2021-03-25 DIAGNOSIS — I2510 Atherosclerotic heart disease of native coronary artery without angina pectoris: Secondary | ICD-10-CM | POA: Diagnosis not present

## 2021-03-25 DIAGNOSIS — Z91040 Latex allergy status: Secondary | ICD-10-CM | POA: Insufficient documentation

## 2021-03-25 DIAGNOSIS — Z7982 Long term (current) use of aspirin: Secondary | ICD-10-CM | POA: Insufficient documentation

## 2021-03-25 DIAGNOSIS — I10 Essential (primary) hypertension: Secondary | ICD-10-CM | POA: Diagnosis not present

## 2021-03-25 LAB — COMPREHENSIVE METABOLIC PANEL
ALT: 29 U/L (ref 0–44)
AST: 31 U/L (ref 15–41)
Albumin: 5.1 g/dL — ABNORMAL HIGH (ref 3.5–5.0)
Alkaline Phosphatase: 135 U/L — ABNORMAL HIGH (ref 38–126)
Anion gap: 10 (ref 5–15)
BUN: 30 mg/dL — ABNORMAL HIGH (ref 8–23)
CO2: 20 mmol/L — ABNORMAL LOW (ref 22–32)
Calcium: 9.9 mg/dL (ref 8.9–10.3)
Chloride: 105 mmol/L (ref 98–111)
Creatinine, Ser: 0.82 mg/dL (ref 0.44–1.00)
GFR, Estimated: >60 mL/min (ref >60)
Glucose, Bld: 149 mg/dL — ABNORMAL HIGH (ref 70–99)
Potassium: 3.7 mmol/L (ref 3.5–5.1)
Sodium: 135 mmol/L (ref 135–145)
Total Bilirubin: 1 mg/dL (ref 0.3–1.2)
Total Protein: 9 g/dL — ABNORMAL HIGH (ref 6.5–8.1)

## 2021-03-25 LAB — CBC
HCT: 49 % — ABNORMAL HIGH (ref 36.0–46.0)
Hemoglobin: 17 g/dL — ABNORMAL HIGH (ref 12.0–15.0)
MCH: 30.5 pg (ref 26.0–34.0)
MCHC: 34.7 g/dL (ref 30.0–36.0)
MCV: 87.8 fL (ref 80.0–100.0)
Platelets: 276 10*3/uL (ref 150–400)
RBC: 5.58 MIL/uL — ABNORMAL HIGH (ref 3.87–5.11)
RDW: 12.6 % (ref 11.5–15.5)
WBC: 8.8 10*3/uL (ref 4.0–10.5)
nRBC: 0 % (ref 0.0–0.2)

## 2021-03-25 LAB — LIPASE, BLOOD: Lipase: 63 U/L — ABNORMAL HIGH (ref 11–51)

## 2021-03-25 MED ORDER — SODIUM CHLORIDE 0.9 % IV SOLN: Freq: Once | INTRAVENOUS | Status: AC

## 2021-03-25 MED ORDER — LACTATED RINGERS IV BOLUS
1000.0000 mL | Freq: Once | INTRAVENOUS | Status: AC
  Administered 2021-03-25: 1000 mL via INTRAVENOUS

## 2021-03-25 MED ORDER — ONDANSETRON HCL 4 MG/2ML IJ SOLN
4.0000 mg | Freq: Once | INTRAMUSCULAR | Status: AC
  Administered 2021-03-25: 4 mg via INTRAVENOUS
  Filled 2021-03-25: qty 2

## 2021-03-25 MED ORDER — ONDANSETRON 4 MG PO TBDP: 4.0000 mg | ORAL_TABLET | Freq: Three times a day (TID) | ORAL | 0 refills | Status: DC | PRN

## 2021-03-25 NOTE — ED Triage Notes (Signed)
Pt to ER via POV with complaints of nausea, vomiting, and diarrhea since Sunday. Pt very weak appearing in triage.  Pt reports that her grandson is currently hospitalized with the same symptoms and cultures show he has cryptosporidium and rotavirus.

## 2021-03-25 NOTE — ED Notes (Signed)
Pt PO challenged per MD Katrinka Blazing order. Pt tolerated diet ginger ale, crackers, and peanut butter well at this time.

## 2021-03-25 NOTE — Discharge Instructions (Signed)
As we discussed, you are likely dealing with a viral stomach bug.  Please use the Zofran nausea medicine as needed for any further nausea, abdominal cramping or diarrhea.  Use up to 3-4 times per day.   If you develop any further worsening symptoms despite this medication.  Fevers with your symptoms or bloody diarrhea, please return to the ED.

## 2021-03-25 NOTE — ED Provider Notes (Signed)
[facility] Emergency Department Provider Note ____________________________________________   Event Date/Time   First MD Initiated Contact with Patient 03/25/21 1722     (approximate)  I have reviewed the triage vital signs and the nursing notes.  HISTORY  Chief Complaint Emesis and Diarrhea   HPI Lindsey Webb is a 75 y.o. femalewho presents to the ED for evaluation of emesis and diarrhea.   Chart review indicates hx HTN, HLD, anxiety and depression.  Obesity.  Patient presents to the ED, accompanied by her husband who provides additional history, for evaluation of about 24 hours of nausea, vomiting and diarrhea.  They report that they care for their grandson at home, and grandson required 2-day observation admission at a local hospital due to dehydration associated with rotavirus and Cryptosporidium.  Grandson returned home 2 days ago.   Patient reports that she has been caring for the grandson throughout his illness, and patient reports developing symptoms last night and throughout the morning today.  She denies any abdominal pain, fever, syncope, chest pain, dysuria, vaginal discharge or bleeding.   She reports recurrent episodes of countless nonbloody nonbilious emesis and watery diarrhea.  She denies any hematochezia, melena, hematemesis.  She reports concern for dehydration.  Past Medical History:  Diagnosis Date  . Anxiety   . Arthritis    osteoarthritis  . COPD (chronic obstructive pulmonary disease) (HCC)   . Depression   . History of cervical cancer   . Hyperlipidemia   . Hypertension   . Osteopenia   . Sleep apnea   . Vitamin D deficiency     Patient Active Problem List   Diagnosis Date Noted  . Major depressive disorder with single episode, in partial remission (HCC) 12/12/2019  . Atherosclerosis of coronary artery without angina pectoris 03/24/2018  . Hypertension 07/12/2017  . COPD (chronic obstructive pulmonary disease) (HCC)  07/12/2017  . Osteopenia 07/12/2017  . Depression 07/12/2017  . History of cervical cancer 07/12/2017  . Hyperlipidemia 07/12/2017  . Vitamin D deficiency 07/12/2017    Past Surgical History:  Procedure Laterality Date  . ABDOMINAL HYSTERECTOMY  1982  . CHOLECYSTECTOMY  1981  . TONSILLECTOMY  1962    Prior to Admission medications   Medication Sig Start Date End Date Taking? Authorizing Provider  ondansetron (ZOFRAN ODT) 4 MG disintegrating tablet Take 1 tablet (4 mg total) by mouth every 8 (eight) hours as needed for nausea or vomiting. 03/25/21  Yes Delton Prairie, MD  albuterol (PROVENTIL HFA;VENTOLIN HFA) 108 (90 Base) MCG/ACT inhaler Inhale 1 puff into the lungs every 6 (six) hours as needed for wheezing or shortness of breath. 03/05/19   Galen Manila, NP  aspirin EC 81 MG tablet Take 81 mg by mouth daily.    [provider]  budesonide-formoterol (SYMBICORT) 160-4.5 MCG/ACT inhaler Inhale 1 puff into the lungs 2 (two) times daily. 12/10/19   Trey Sailors, PA-C  calcium carbonate (OS-CAL) 600 MG TABS tablet Take 600 mg by mouth 2 (two) times daily with a meal.    [provider]  cholecalciferol (VITAMIN D) 400 units TABS tablet Take 5,000 Units by mouth daily.    [provider]  lisinopril (ZESTRIL) 30 MG tablet TAKE 1 TABLET BY MOUTH EVERY DAY 01/31/21   Trey Sailors, PA-C  sertraline (ZOLOFT) 50 MG tablet Take 1 tablet (50 mg total) by mouth daily. 11/27/20   Trey Sailors, PA-C  simvastatin (ZOCOR) 40 MG tablet TAKE 1 TABLET (40 MG TOTAL) BY  MOUTH DAILY. PLEASE SCHEDULE AN OFFICE VISIT BEFORE ANYMORE REFILLS. 01/19/21   Trey Sailors, PA-C    Allergies Latex and Sodium hypochlorite  Family History  Problem Relation Age of Onset  . Prostate cancer Father   . Brain cancer Father   . Colon polyps Father   . Brain cancer Mother   . Brain cancer Paternal Grandmother     Social History Social History   Tobacco Use  .  Smoking status: Former Smoker    Packs/day: 1.00    Years: 45.00    Pack years: 45.00    Types: Cigarettes    Quit date: 01/13/2016    Years since quitting: 5.2  . Smokeless tobacco: Never Used  Vaping Use  . Vaping Use: Never used  Substance Use Topics  . Alcohol use: No  . Drug use: No    Review of Systems  Constitutional: No fever/chills Eyes: No visual changes. ENT: No sore throat. Cardiovascular: Denies chest pain. Respiratory: Denies shortness of breath. Gastrointestinal: Positive for nausea, vomiting and diarrhea. No abdominal pain.   No constipation. Genitourinary: Negative for dysuria. Musculoskeletal: Negative for back pain. Skin: Negative for rash. Neurological: Negative for headaches, focal weakness or numbness.  ____________________________________________   PHYSICAL EXAM:  VITAL SIGNS: Vitals:   03/25/21 1713 03/25/21 1846  BP: 130/74 (!) 143/73  Pulse: (!) 101 96  Resp: 18 18  Temp:    SpO2: 97% 96%     Constitutional: Alert and oriented. Well appearing and in no acute distress. Eyes: Conjunctivae are normal. PERRL. EOMI. Head: Atraumatic. Nose: No congestion/rhinnorhea. Mouth/Throat: Mucous membranes are dry.  Oropharynx non-erythematous. Neck: No stridor. No cervical spine tenderness to palpation. Cardiovascular: Tachycardic rate, regular rhythm. Grossly normal heart sounds.  Good peripheral circulation. Respiratory: Normal respiratory effort.  No retractions. Lungs CTAB. Gastrointestinal: Soft , nondistended, nontender to palpation. No CVA tenderness.  Benign throughout Musculoskeletal: No lower extremity tenderness nor edema.  No joint effusions. No signs of acute trauma. Neurologic:  Normal speech and language. No gross focal neurologic deficits are appreciated. No gait instability noted. Skin:  Skin is warm, dry and intact. No rash noted. Psychiatric: Mood and affect are normal. Speech and behavior are  normal.  ____________________________________________   LABS (all labs ordered are listed, but only abnormal results are displayed)  Labs Reviewed  LIPASE, BLOOD - Abnormal; Notable for the following components:      Result Value   Lipase 63 (*)    All other components within normal limits  COMPREHENSIVE METABOLIC PANEL - Abnormal; Notable for the following components:   CO2 20 (*)    Glucose, Bld 149 (*)    BUN 30 (*)    Total Protein 9.0 (*)    Albumin 5.1 (*)    Alkaline Phosphatase 135 (*)    All other components within normal limits  CBC - Abnormal; Notable for the following components:   RBC 5.58 (*)    Hemoglobin 17.0 (*)    HCT 49.0 (*)    All other components within normal limits  URINALYSIS, COMPLETE (UACMP) WITH MICROSCOPIC   ____________________________________________  12 Lead EKG  Sinus rhythm, rate of 123 bpm.  Normal axis and intervals.  No evidence of acute ischemia.  Sinus tachycardia. ____________________________________________   PROCEDURES and INTERVENTIONS  Procedure(s) performed (including Critical Care):  .1-3 Lead EKG Interpretation Performed by: Delton Prairie, MD Authorized by: Delton Prairie, MD     Interpretation: abnormal     ECG rate:  106  ECG rate assessment: tachycardic     Rhythm: sinus tachycardia     Ectopy: none     Conduction: normal      Medications  0.9 %  sodium chloride infusion ( Intravenous Stopped 03/25/21 1550)  lactated ringers bolus 1,000 mL (0 mLs Intravenous Stopped 03/25/21 2010)  ondansetron (ZOFRAN) injection 4 mg (4 mg Intravenous Given 03/25/21 1751)    ____________________________________________   MDM / ED COURSE   75 year old woman presents to the ED with nausea, vomiting and diarrhea with evidence of dehydration, likely due to viral gastroenteritis, and ultimately amenable to outpatient management.  Presents tachycardic, likely due to volume depletion, resolving after IVF and otherwise hemodynamically  stable without fever.  Exam demonstrates a benign abdomen and dry mucous membranes.  She otherwise looks well without neurovascular deficits or any distress.  Blood work with stigmata of mild dehydration as well with slight decrease in bicarbonate and elevation of BUN, though otherwise unremarkable.  Patient has had no fevers, no leukocytosis and no abdominal pain or tenderness upon my examination to necessitate imaging of her abdomen at this time.  Extremely unlikely to represent diverticulitis, SBO.  Provided fluid resuscitation and antiemetics with resolution of her symptoms and tachycardia.  Subsequently tolerating p.o. intake and I see no barriers to outpatient management.  We discussed return precautions for the ED and provide patient with Zofran prescription for symptom control at home.  Stable for outpatient management   Clinical Course as of 03/25/21 2332  Wed Mar 25, 2021  1954 Reassessed.  Patient reports she is doing well.  She is sitting up in bed and looks improved.  Tachycardia has resolved.  Husband is reassured that the color is returned to her face and she looks well.  She is tolerating p.o. intake of solids and liquids and is requesting discharge.  We discussed Zofran prescription as an outpatient and we discussed return precautions for the ED. [DS]    Clinical Course User Index [DS] Delton Prairie, MD    ____________________________________________   FINAL CLINICAL IMPRESSION(S) / ED DIAGNOSES  Final diagnoses:  Dehydration  Nausea vomiting and diarrhea     ED Discharge Orders         Ordered    ondansetron (ZOFRAN ODT) 4 MG disintegrating tablet  Every 8 hours PRN        03/25/21 2006           Efrain Clauson   Note:  This document was prepared using Conservation officer, historic buildings and may include unintentional dictation errors.   Delton Prairie, MD 03/25/21 [id number]

## 2021-03-25 NOTE — ED Notes (Signed)
Pt verbalized understanding of d/c instructions at this time. Pt denied further questions at this time. Pt ambulatory to ED entrance with family at this time, NAD noted, steady gait noted, RR even and unlabored at this time.

## 2021-05-01 ENCOUNTER — Other Ambulatory Visit: Payer: Self-pay

## 2021-05-01 DIAGNOSIS — I10 Essential (primary) hypertension: Secondary | ICD-10-CM

## 2021-05-01 NOTE — Telephone Encounter (Signed)
[facility] faxed refill request for the following medications:  lisinopril (ZESTRIL) 30 MG tablet    Please advise.  

## 2021-05-04 MED ORDER — LISINOPRIL 30 MG PO TABS: 30.0000 mg | ORAL_TABLET | Freq: Every day | ORAL | 0 refills | Status: DC

## 2021-05-04 NOTE — Telephone Encounter (Signed)
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 Thanks!

## 2021-05-25 ENCOUNTER — Telehealth: Payer: Self-pay | Admitting: Physician Assistant

## 2021-05-25 DIAGNOSIS — F32A Depression, unspecified: Secondary | ICD-10-CM

## 2021-05-25 MED ORDER — SERTRALINE HCL 50 MG PO TABS: 50.0000 mg | ORAL_TABLET | Freq: Every day | ORAL | 0 refills | Status: DC

## 2021-05-25 NOTE — Telephone Encounter (Signed)
[facility] faxed refill request for the following medications:  sertraline (ZOLOFT) 50 MG tablet  Last Rx: 11/27/20 Qty: 90 Refills: 1 LOV: 11/27/20 No upcoming scheduled appt Please advise. Thanks TNP

## 2021-06-12 ENCOUNTER — Telehealth: Payer: Self-pay

## 2021-06-12 NOTE — Telephone Encounter (Signed)
[facility] faxed refill request for the following medications:  simvastatin (ZOCOR) 40 MG tablet  Please advise. Thanks TNP

## 2021-06-12 NOTE — Telephone Encounter (Signed)
Per last refill approval on 01/19/2021, patient needs to schedule follow up appointment. Tried calling patient. Left message to call back to schedule follow up appointment. OK for PEC to schedule appointment with any provider in the office.

## 2021-07-09 ENCOUNTER — Telehealth: Payer: Self-pay | Admitting: Physician Assistant

## 2021-07-09 DIAGNOSIS — E785 Hyperlipidemia, unspecified: Secondary | ICD-10-CM

## 2021-07-09 MED ORDER — SIMVASTATIN 40 MG PO TABS: 40.0000 mg | ORAL_TABLET | Freq: Every day | ORAL | 1 refills | Status: AC

## 2021-07-09 NOTE — Telephone Encounter (Signed)
[facility] faxed refill request for the following medications:  simvastatin (ZOCOR) 40 MG tablet    Please advise.  

## 2021-07-30 ENCOUNTER — Other Ambulatory Visit: Payer: Self-pay | Admitting: Family Medicine

## 2021-07-30 DIAGNOSIS — I10 Essential (primary) hypertension: Secondary | ICD-10-CM

## 2021-07-30 NOTE — Telephone Encounter (Signed)
Requested medications are due for refill today.  yes  Requested medications are on the active medications list.  yes  Last refill. 05/04/2021  Future visit scheduled.   no  Notes to clinic.  PCP is listed as [name]. No appointment with a different provider.

## 2021-08-20 ENCOUNTER — Other Ambulatory Visit: Payer: Self-pay | Admitting: Family Medicine

## 2021-08-20 DIAGNOSIS — F32A Depression, unspecified: Secondary | ICD-10-CM

## 2021-08-20 NOTE — Telephone Encounter (Signed)
Requested medications are due for refill today.  yes  Requested medications are on the active medications list.  yes  Last refill. 05/25/2021  Future visit scheduled.   no  Notes to clinic.  PCP listed is Ms. Pollack. Pt has not seen another provider, and has no scheduled appointment.

## 2021-11-02 ENCOUNTER — Telehealth: Payer: Self-pay | Admitting: Family Medicine

## 2021-11-02 NOTE — Telephone Encounter (Signed)
Copied from CRM [id number]. Topic: Medicare AWV >> Nov 02, 2021  2:00 PM Claudette Laws R wrote: Reason for CRM:  Left message for patient to call back and schedule Medicare Annual Wellness Visit (AWV) in office.   If not able to come in office, please offer to do virtually or by telephone.   Last AWV: 03/10/2020  Please schedule at anytime with [facility] Advisor.  If any questions, please contact me at [phone]

## 2022-03-17 IMAGING — CR DG ANKLE COMPLETE 3+V*L*
1 series · 3 of 3 positions shown · non-contrast
Comparison: None.

CLINICAL DATA: Left foot and ankle pain. Pain with weight-bearing.

EXAM:
LEFT FOOT - COMPLETE 3+ VIEW; LEFT ANKLE COMPLETE - 3+ VIEW

[Series 1: dg ankle complete left · 0.14mm/px · 3 of 3 slices shown]
[im 1/3]
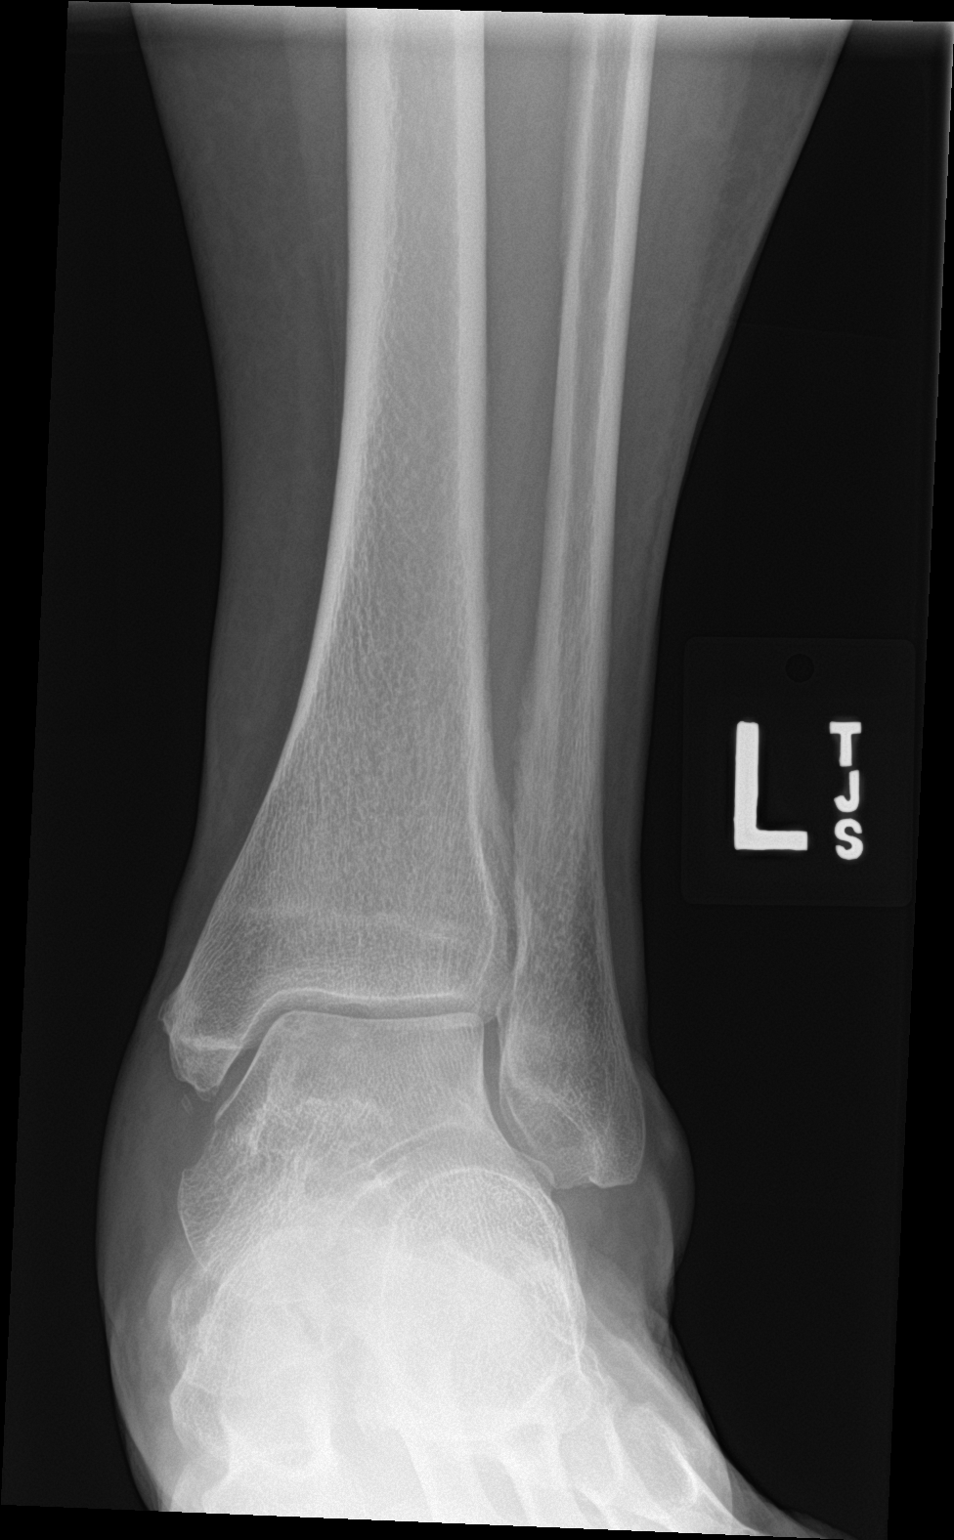
[im 2/3]
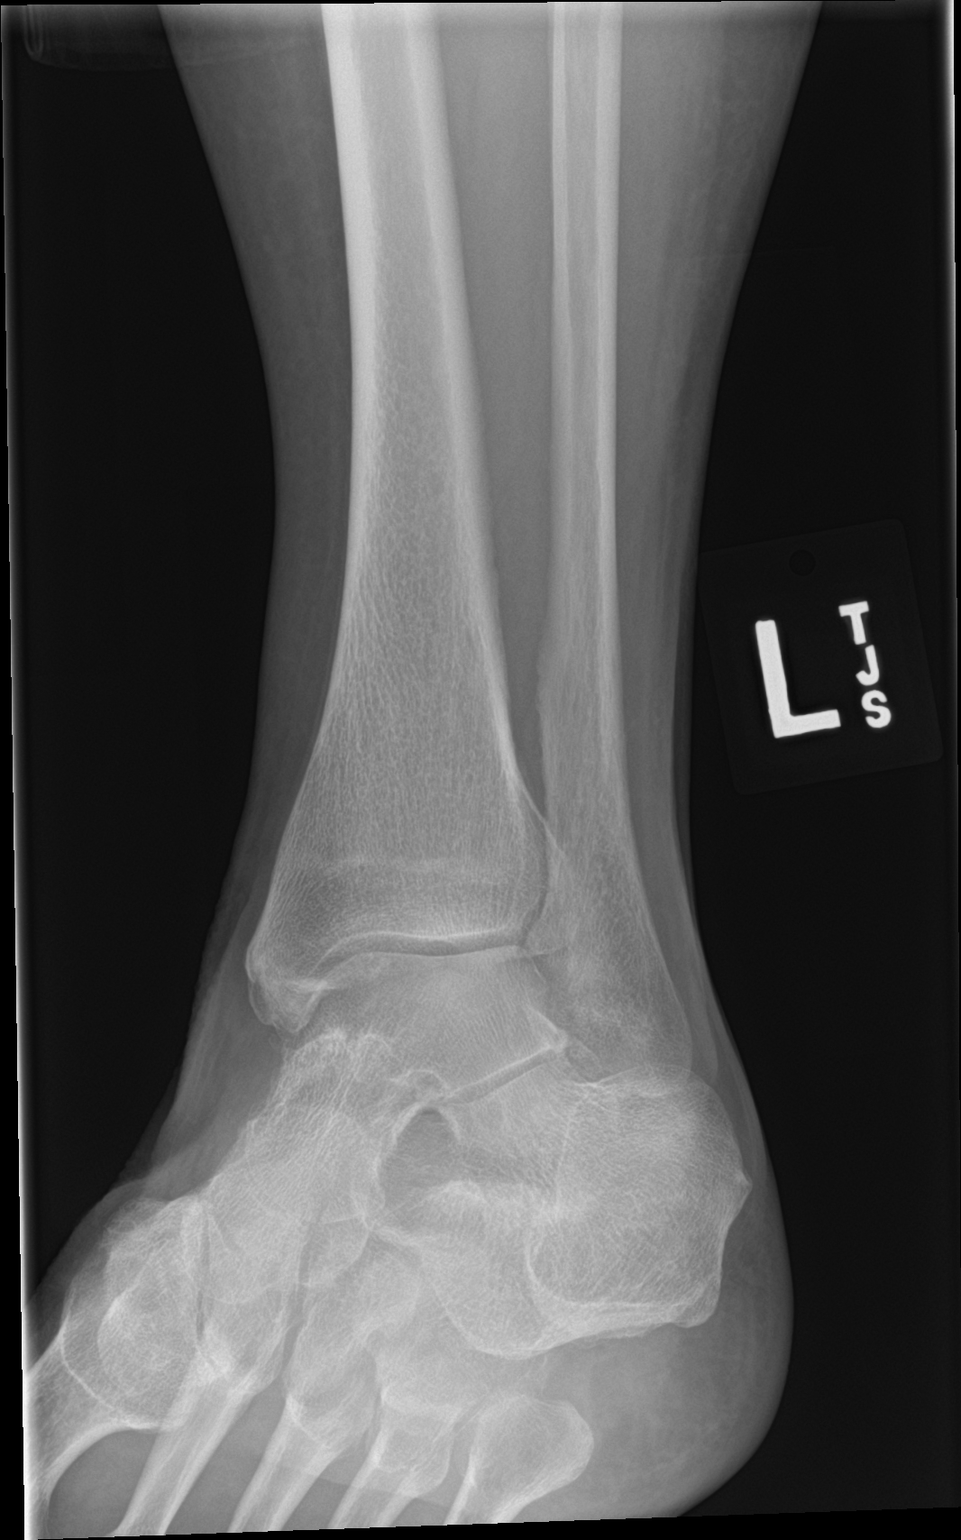
[im 3/3]
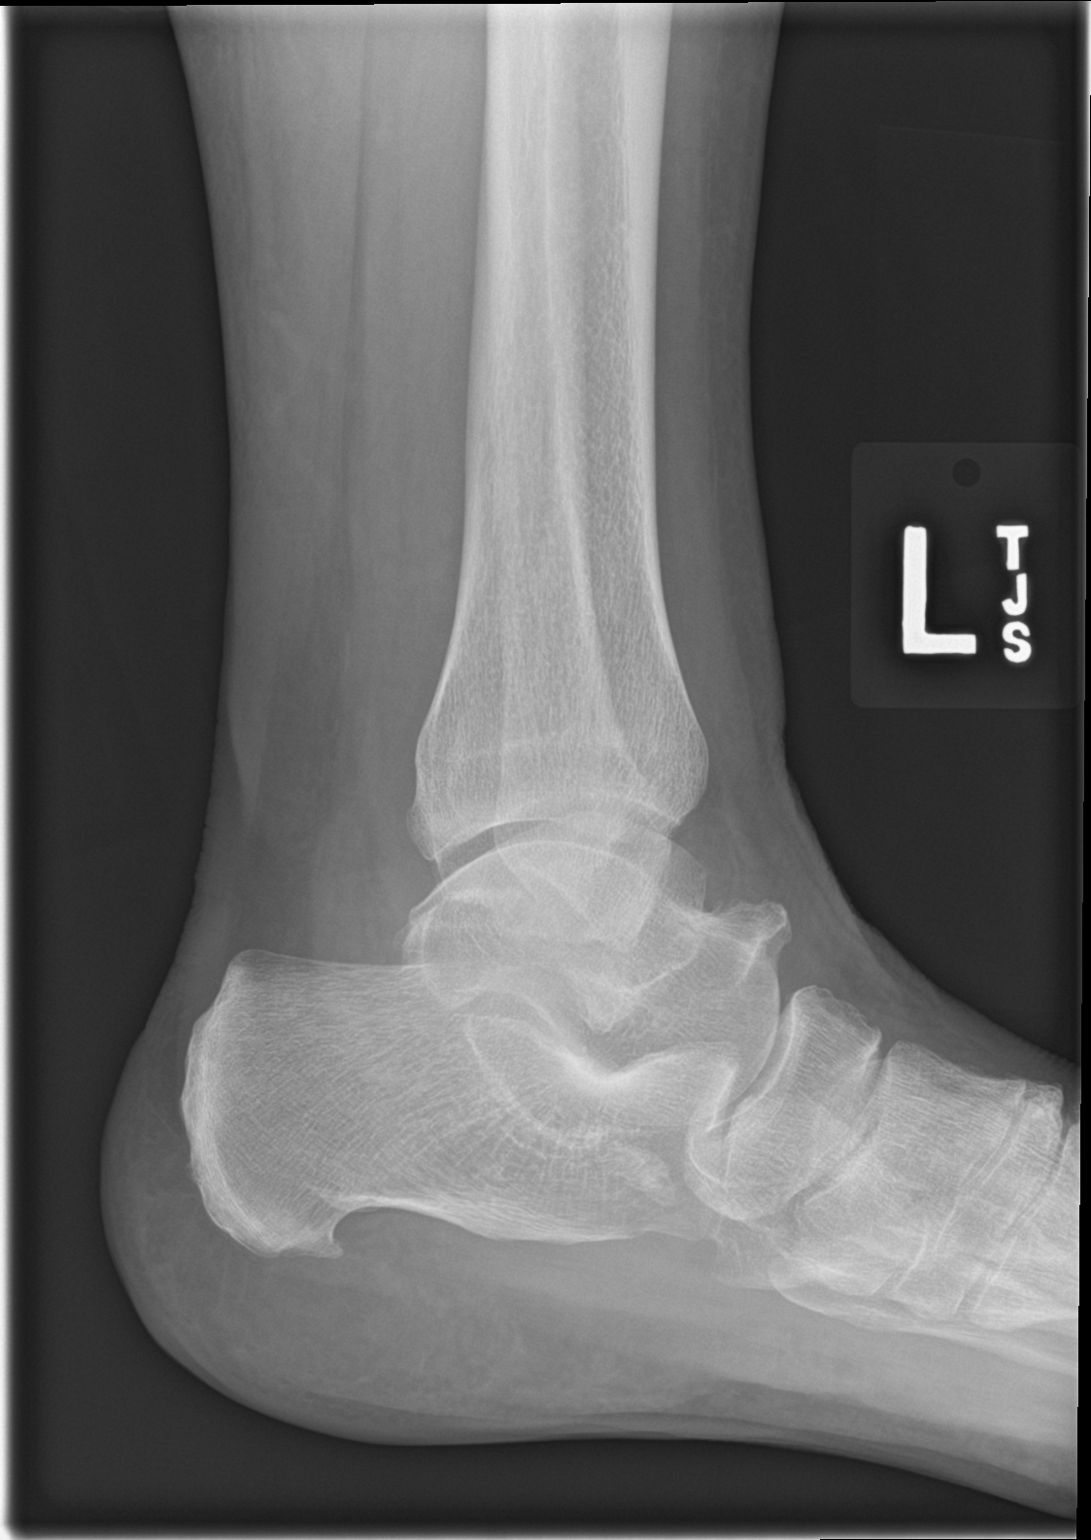

[3 of 3 positions shown; findings below may reference images not displayed]

FINDINGS: Soft tissue swelling is present medial to the talus, below the
medial malleolus. Small bone fragment is present off the tip of the
medial malleolus. It is well corticated. Ankle is located. No
effusion is present.

There is some flattening of the plantar arch. Small calcaneal spur
is noted. No acute or healing fracture is present.
IMPRESSION: 1. Soft tissue swelling medial to the talus without underlying
fracture.
2. Remote trauma to the medial malleolus or calcification in the
ligaments.
3. Flattening of the plantar arch.

## 2022-04-09 ENCOUNTER — Telehealth: Payer: Self-pay | Admitting: *Deleted

## 2022-04-09 NOTE — Telephone Encounter (Signed)
LMTC to schedule Yearly Lung CA CT Scan. 

## 2023-01-30 LAB — EXTERNAL GENERIC LAB PROCEDURE: COLOGUARD: NEGATIVE

## 2023-01-30 LAB — COLOGUARD: COLOGUARD: NEGATIVE

## 2023-02-17 LAB — HM DEXA SCAN

## 2023-02-21 LAB — HM MAMMOGRAPHY

## 2024-02-16 ENCOUNTER — Other Ambulatory Visit (HOSPITAL_BASED_OUTPATIENT_CLINIC_OR_DEPARTMENT_OTHER): Payer: Self-pay

## 2024-02-16 ENCOUNTER — Ambulatory Visit (INDEPENDENT_AMBULATORY_CARE_PROVIDER_SITE_OTHER): Payer: Medicare Other | Admitting: Family Medicine

## 2024-02-16 ENCOUNTER — Encounter: Payer: Self-pay | Admitting: Family Medicine

## 2024-02-16 ENCOUNTER — Other Ambulatory Visit: Payer: Self-pay

## 2024-02-16 VITALS — BP 140/90 | HR 88 | Temp 97.7°F | Resp 18 | Ht 63.0 in | Wt 192.0 lb

## 2024-02-16 DIAGNOSIS — Z87891 Personal history of nicotine dependence: Secondary | ICD-10-CM

## 2024-02-16 DIAGNOSIS — Z0000 Encounter for general adult medical examination without abnormal findings: Secondary | ICD-10-CM | POA: Diagnosis not present

## 2024-02-16 DIAGNOSIS — G4733 Obstructive sleep apnea (adult) (pediatric): Secondary | ICD-10-CM | POA: Insufficient documentation

## 2024-02-16 DIAGNOSIS — I10 Essential (primary) hypertension: Secondary | ICD-10-CM

## 2024-02-16 DIAGNOSIS — J42 Unspecified chronic bronchitis: Secondary | ICD-10-CM | POA: Diagnosis not present

## 2024-02-16 DIAGNOSIS — E785 Hyperlipidemia, unspecified: Secondary | ICD-10-CM | POA: Diagnosis not present

## 2024-02-16 DIAGNOSIS — T7840XA Allergy, unspecified, initial encounter: Secondary | ICD-10-CM | POA: Insufficient documentation

## 2024-02-16 DIAGNOSIS — Z122 Encounter for screening for malignant neoplasm of respiratory organs: Secondary | ICD-10-CM

## 2024-02-16 DIAGNOSIS — E559 Vitamin D deficiency, unspecified: Secondary | ICD-10-CM

## 2024-02-16 LAB — COMPREHENSIVE METABOLIC PANEL
ALT: 16 U/L (ref 0–35)
AST: 18 U/L (ref 0–37)
Albumin: 4.6 g/dL (ref 3.5–5.2)
Alkaline Phosphatase: 115 U/L (ref 39–117)
BUN: 16 mg/dL (ref 6–23)
CO2: 30 meq/L (ref 19–32)
Calcium: 9.7 mg/dL (ref 8.4–10.5)
Chloride: 103 meq/L (ref 96–112)
Creatinine, Ser: 0.59 mg/dL (ref 0.40–1.20)
GFR: 87.79 mL/min (ref >60.00)
Glucose, Bld: 102 mg/dL — ABNORMAL HIGH (ref 70–99)
Potassium: 4.3 meq/L (ref 3.5–5.1)
Sodium: 141 meq/L (ref 135–145)
Total Bilirubin: 0.8 mg/dL (ref 0.2–1.2)
Total Protein: 7.2 g/dL (ref 6.0–8.3)

## 2024-02-16 LAB — CBC WITH DIFFERENTIAL/PLATELET
Basophils Absolute: 0.1 10*3/uL (ref 0.0–0.1)
Basophils Relative: 1.1 % (ref 0.0–3.0)
Eosinophils Absolute: 0.2 10*3/uL (ref 0.0–0.7)
Eosinophils Relative: 3.4 % (ref 0.0–5.0)
HCT: 43.2 % (ref 36.0–46.0)
Hemoglobin: 14.8 g/dL (ref 12.0–15.0)
Lymphocytes Relative: 27.6 % (ref 12.0–46.0)
Lymphs Abs: 1.6 10*3/uL (ref 0.7–4.0)
MCHC: 34.3 g/dL (ref 30.0–36.0)
MCV: 91.9 fl (ref 78.0–100.0)
Monocytes Absolute: 0.4 10*3/uL (ref 0.1–1.0)
Monocytes Relative: 6.9 % (ref 3.0–12.0)
Neutro Abs: 3.6 10*3/uL (ref 1.4–7.7)
Neutrophils Relative %: 61 % (ref 43.0–77.0)
Platelets: 242 10*3/uL (ref 150.0–400.0)
RBC: 4.7 Mil/uL (ref 3.87–5.11)
RDW: 12.7 % (ref 11.5–15.5)
WBC: 5.8 10*3/uL (ref 4.0–10.5)

## 2024-02-16 LAB — LIPID PANEL
Cholesterol: 142 mg/dL (ref 0–200)
HDL: 52.3 mg/dL (ref >39.00)
LDL Cholesterol: 62 mg/dL (ref 0–99)
NonHDL: 90.05
Total CHOL/HDL Ratio: 3
Triglycerides: 139 mg/dL (ref 0.0–149.0)
VLDL: 27.8 mg/dL (ref 0.0–40.0)

## 2024-02-16 LAB — VITAMIN D 25 HYDROXY (VIT D DEFICIENCY, FRACTURES): VITD: 52.16 ng/mL (ref 30.00–100.00)

## 2024-02-16 LAB — TSH: TSH: 0.79 u[IU]/mL (ref 0.35–5.50)

## 2024-02-16 MED ORDER — LORATADINE 10 MG PO TABS: 10.0000 mg | ORAL_TABLET | Freq: Every day | ORAL | Status: DC

## 2024-02-16 MED ORDER — TETANUS-DIPHTH-ACELL PERTUSSIS 5-2.5-18.5 LF-MCG/0.5 IM SUSY
0.5000 mL | PREFILLED_SYRINGE | Freq: Once | INTRAMUSCULAR | 0 refills | Status: AC
  Filled 2024-02-16: qty 0.5, 1d supply, fill #0

## 2024-02-16 NOTE — Patient Instructions (Signed)
 Preventive Care 43 Years and Older, Female Preventive care refers to lifestyle choices and visits with your health care provider that can promote health and wellness. Preventive care visits are also called wellness exams. What can I expect for my preventive care visit? Counseling Your health care provider may ask you questions about your: Medical history, including: Past medical problems. Family medical history. Pregnancy and menstrual history. History of falls. Current health, including: Memory and ability to understand (cognition). Emotional well-being. Home life and relationship well-being. Sexual activity and sexual health. Lifestyle, including: Alcohol, nicotine or tobacco, and drug use. Access to firearms. Diet, exercise, and sleep habits. Work and work Astronomer. Sunscreen use. Safety issues such as seatbelt and bike helmet use. Physical exam Your health care provider will check your: Height and weight. These may be used to calculate your BMI (body mass index). BMI is a measurement that tells if you are at a healthy weight. Waist circumference. This measures the distance around your waistline. This measurement also tells if you are at a healthy weight and may help predict your risk of certain diseases, such as type 2 diabetes and high blood pressure. Heart rate and blood pressure. Body temperature. Skin for abnormal spots. What immunizations do I need?  Vaccines are usually given at various ages, according to a schedule. Your health care provider will recommend vaccines for you based on your age, medical history, and lifestyle or other factors, such as travel or where you work. What tests do I need? Screening Your health care provider may recommend screening tests for certain conditions. This may include: Lipid and cholesterol levels. Hepatitis C test. Hepatitis B test. HIV (human immunodeficiency virus) test. STI (sexually transmitted infection) testing, if you are at  risk. Lung cancer screening. Colorectal cancer screening. Diabetes screening. This is done by checking your blood sugar (glucose) after you have not eaten for a while (fasting). Mammogram. Talk with your health care provider about how often you should have regular mammograms. BRCA-related cancer screening. This may be done if you have a family history of breast, ovarian, tubal, or peritoneal cancers. Bone density scan. This is done to screen for osteoporosis. Talk with your health care provider about your test results, treatment options, and if necessary, the need for more tests. Follow these instructions at home: Eating and drinking  Eat a diet that includes fresh fruits and vegetables, whole grains, lean protein, and low-fat dairy products. Limit your intake of foods with high amounts of sugar, saturated fats, and salt. Take vitamin and mineral supplements as recommended by your health care provider. Do not drink alcohol if your health care provider tells you not to drink. If you drink alcohol: Limit how much you have to 0-1 drink a day. Know how much alcohol is in your drink. In the [place] one drink equals one 12 oz bottle of beer (355 mL), one 5 oz glass of wine (148 mL), or one 1 oz glass of hard liquor (44 mL). Lifestyle Brush your teeth every morning and night with fluoride toothpaste. Floss one time each day. Exercise for at least 30 minutes 5 or more days each week. Do not use any products that contain nicotine or tobacco. These products include cigarettes, chewing tobacco, and vaping devices, such as e-cigarettes. If you need help quitting, ask your health care provider. Do not use drugs. If you are sexually active, practice safe sex. Use a condom or other form of protection in order to prevent STIs. Take aspirin only as told by  your health care provider. Make sure that you understand how much to take and what form to take. Work with your health care provider to find out whether it  is safe and beneficial for you to take aspirin daily. Ask your health care provider if you need to take a cholesterol-lowering medicine (statin). Find healthy ways to manage stress, such as: Meditation, yoga, or listening to music. Journaling. Talking to a trusted person. Spending time with friends and family. Minimize exposure to UV radiation to reduce your risk of skin cancer. Safety Always wear your seat belt while driving or riding in a vehicle. Do not drive: If you have been drinking alcohol. Do not ride with someone who has been drinking. When you are tired or distracted. While texting. If you have been using any mind-altering substances or drugs. Wear a helmet and other protective equipment during sports activities. If you have firearms in your house, make sure you follow all gun safety procedures. What's next? Visit your health care provider once a year for an annual wellness visit. Ask your health care provider how often you should have your eyes and teeth checked. Stay up to date on all vaccines. This information is not intended to replace advice given to you by your health care provider. Make sure you discuss any questions you have with your health care provider. Document Revised: 06/03/2021 Document Reviewed: 06/03/2021 Elsevier Patient Education  2024 [employer].

## 2024-02-16 NOTE — Assessment & Plan Note (Signed)
 Stable Symbicort and albuterol

## 2024-02-16 NOTE — Assessment & Plan Note (Signed)
 Ghm utd Check labs  Health Maintenance  Topic Date Due   DTaP/Tdap/Td (1 - Tdap) Never done   Zoster Vaccines- Shingrix (1 of 2) Never done   Medicare Annual Wellness (AWV)  03/10/2021   Lung Cancer Screening  03/04/2022   COVID-19 Vaccine (3 - 2024-25 season) 08/21/2023   DEXA SCAN  10/07/2024   Pneumonia Vaccine 27+ Years old  Completed   INFLUENZA VACCINE  Completed   Hepatitis C Screening  Completed   HPV VACCINES  Aged Out   Colonoscopy  Discontinued   Fecal DNA (Cologuard)  Discontinued

## 2024-02-16 NOTE — Progress Notes (Signed)
 Established Patient Office Visit  Subjective   Patient ID: Lindsey Webb, female    DOB: [birth year]  Age: 78 y.o. MRN: [id number]  Chief Complaint  Patient presents with   New Patient (Initial Visit)    Pt states wanting to get establish and would like medicare wellness check up    HPI Discussed the use of AI scribe software for clinical note transcription with the patient, who gave verbal consent to proceed.  History of Present Illness   Lindsey Webb is a 78 year old female who presents for an annual wellness visit.  She completed several screenings last year, including a Cologuard test which was negative, a mammogram which initially raised concerns but was ultimately normal due to dense tissue, and a bone density test. A lung screening in 2023 revealed a spot, but she was unable to follow up last year due to financial constraints.  She has a history of COPD and uses Symbicort and albuterol. She quit smoking in 2017. Pollen exacerbates her symptoms, for which she takes an over-the-counter antihistamine, likely loratadine.  She has a history of anxiety and depression, currently well-controlled. Previous exacerbations were attributed to living with her son's family, including five children, which she found stressful. She moved to [facility] in 2022, which she feels has improved her situation.  She has a history of vitamin D deficiency and takes 5000 IU of vitamin D daily.  She reports a history of sleep apnea but discontinued CPAP use due to discomfort. She has not pursued alternative treatments.  She has a history of hypertension and hyperlipidemia, though specific medications are not discussed.  No current use of CPAP for sleep apnea. Reports arthritis in the neck but no history of surgery. Reports sneezing due to high pollen levels.      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   Atherosclerosis of coronary artery without angina pectoris 03/24/2018   Hypertension 07/12/2017   COPD (chronic obstructive pulmonary disease) (HCC) 07/12/2017   Osteopenia 07/12/2017   Depression 07/12/2017   History of cervical cancer 07/12/2017   Hyperlipidemia 07/12/2017   Vitamin D deficiency 07/12/2017   Past Medical History:  Diagnosis Date   Anxiety    Arthritis    osteoarthritis   COPD (chronic obstructive pulmonary disease) (HCC)    Depression    History of cervical cancer    Hyperlipidemia    Hypertension    Osteopenia    Sleep apnea    Vitamin D deficiency    Past Surgical History:  Procedure Laterality Date   ABDOMINAL HYSTERECTOMY  1982   CHOLECYSTECTOMY  1981   TONSILLECTOMY  1962   Social History   Tobacco Use   Smoking status: Former    Current packs/day: 0.00    Average packs/day: 1 pack/day for 45.0 years (45.0 ttl pk-yrs)    Types: Cigarettes    Start date: 01/12/1971    Quit date: 01/13/2016    Years since quitting: 8.0   Smokeless tobacco: Never  Vaping Use   Vaping status: Never Used  Substance Use Topics   Alcohol use: No   Drug use: No   Social History   Socioeconomic History   Marital status: Married    Spouse name: Not on file   Number of children: 2   Years of  education: Not on file   Highest education level: GED or equivalent  Occupational History   Occupation: retired  Tobacco Use   Smoking status: Former    Current packs/day: 0.00    Average packs/day: 1 pack/day for 45.0 years (45.0 ttl pk-yrs)    Types: Cigarettes    Start date: 01/12/1971    Quit date: 01/13/2016    Years since quitting: 8.0   Smokeless tobacco: Never  Vaping Use   Vaping status: Never Used  Substance and Sexual Activity   Alcohol use: No   Drug use: No   Sexual activity: Yes    Birth control/protection: Post-menopausal, Surgical  Other Topics  Concern   Not on file  Social History Narrative   Retired, goes to [employer] --- no    Social Drivers of Corporate investment banker Strain: Low Risk  (07/01/2022)   Received from [employer], [facility] visits prior to 02/19/2023.   Overall Financial Resource Strain (CARDIA)    Difficulty of Paying Living Expenses: Not hard at all  Food Insecurity: No Food Insecurity (01/05/2023)   Received from [facility], [facility] visits prior to 02/19/2023.   Hunger Vital Sign    Worried About Running Out of Food in the Last Year: Never true    Ran Out of Food in the Last Year: Never true  Transportation Needs: No Transportation Needs (01/05/2023)   Received from [facility], [facility] visits prior to 02/19/2023.   PRAPARE - Administrator, Civil Service (Medical): No    Lack of Transportation (Non-Medical): No  Physical Activity: Inactive (07/01/2022)   Received from [facility], [facility] visits prior to 02/19/2023.   Exercise Vital Sign    Days of Exercise per Week: 0 days    Minutes of Exercise per Session: 0 min  Stress: Stress Concern Present (07/01/2022)   Received from [facility], [facility] visits prior to 02/19/2023.   [employer] of Occupational Health - Occupational Stress Questionnaire    Feeling of Stress : Rather much  Social Connections: Moderately Isolated (07/01/2022)   Received from [facility], [facility] visits prior to 02/19/2023.   Social Advertising account executive [NHANES]    Frequency of Communication with Friends and Family: Once a week    Frequency of Social Gatherings with Friends and Family: Once a week    Attends Religious Services: 1 to 4 times per year    Active Member of [employer] or Organizations: No    Attends Banker Meetings: Never    Marital Status: Married  Catering manager  Violence: Not At Risk (03/10/2020)   Humiliation, Afraid, Rape, and Kick questionnaire    Fear of Current or Ex-Partner: No    Emotionally Abused: No    Physically Abused: No    Sexually Abused: No   Family Status  Relation Name Status   Father  Deceased at age 101   Mother  Deceased at age 60   PGM  (Not Specified)  No partnership data on file   Family History  Problem Relation Age of Onset   Prostate cancer Father    Brain cancer Father    Colon polyps Father    Brain cancer Mother    Brain cancer Paternal Grandmother    Allergies  Allergen Reactions   Gabapentin Itching   Latex Itching and  Rash   Sodium Hypochlorite Itching and Rash    Clorox      Review of Systems  Constitutional:  Negative for fever and malaise/fatigue.  HENT:  Negative for congestion.   Eyes:  Negative for blurred vision.  Respiratory:  Negative for cough and shortness of breath.   Cardiovascular:  Negative for chest pain, palpitations and leg swelling.  Gastrointestinal:  Negative for abdominal pain, blood in stool, nausea and vomiting.  Genitourinary:  Negative for dysuria and frequency.  Musculoskeletal:  Negative for back pain and falls.  Skin:  Negative for rash.  Neurological:  Negative for dizziness, loss of consciousness and headaches.  Endo/Heme/Allergies:  Negative for environmental allergies.  Psychiatric/Behavioral:  Negative for depression. The patient is not nervous/anxious.       Objective:     BP (!) 140/90 (BP Location: Left Arm, Patient Position: Sitting, Cuff Size: Large)   Pulse 88   Temp 97.7 F (36.5 C) (Oral)   Resp 18   Ht 5\' 3"  (1.6 m)   Wt 192 lb (87.1 kg)   SpO2 96%   BMI 34.01 kg/m  BP Readings from Last 3 Encounters:  02/16/24 (!) 140/90  03/25/21 (!) 143/73  11/27/20 (!) 148/79   Wt Readings from Last 3 Encounters:  02/16/24 192 lb (87.1 kg)  03/25/21 180 lb (81.6 kg)  03/04/21 181 lb (82.1 kg)   SpO2 Readings from Last 3 Encounters:  02/16/24  96%  03/25/21 96%  08/01/19 99%      Physical Exam Vitals and nursing note reviewed.  Constitutional:      General: She is not in acute distress.    Appearance: Normal appearance. She is well-developed.  HENT:     Head: Normocephalic and atraumatic.     Right Ear: Tympanic membrane, ear canal and external ear normal. There is no impacted cerumen.     Left Ear: Tympanic membrane, ear canal and external ear normal. There is no impacted cerumen.     Nose: Nose normal.     Mouth/Throat:     Mouth: Mucous membranes are moist.     Pharynx: Oropharynx is clear. No oropharyngeal exudate or posterior oropharyngeal erythema.  Eyes:     General: No scleral icterus.       Right eye: No discharge.        Left eye: No discharge.     Conjunctiva/sclera: Conjunctivae normal.     Pupils: Pupils are equal, round, and reactive to light.  Neck:     Thyroid: No thyromegaly or thyroid tenderness.     Vascular: No JVD.  Cardiovascular:     Rate and Rhythm: Normal rate and regular rhythm.     Heart sounds: Normal heart sounds. No murmur heard. Pulmonary:     Effort: Pulmonary effort is normal. No respiratory distress.     Breath sounds: Normal breath sounds.  Abdominal:     General: Bowel sounds are normal. There is no distension.     Palpations: Abdomen is soft. There is no mass.     Tenderness: There is no abdominal tenderness. There is no guarding or rebound.  Genitourinary:    Vagina: Normal.  Musculoskeletal:        General: Normal range of motion.     Cervical back: Normal range of motion and neck supple.     Right lower leg: No edema.     Left lower leg: No edema.  Lymphadenopathy:     Cervical: No cervical adenopathy.  Skin:  General: Skin is warm and dry.     Findings: No erythema or rash.  Neurological:     Mental Status: She is alert and oriented to person, place, and time.     Cranial Nerves: No cranial nerve deficit.     Deep Tendon Reflexes: Reflexes are normal and  symmetric.  Psychiatric:        Mood and Affect: Mood normal.        Behavior: Behavior normal.        Thought Content: Thought content normal.        Judgment: Judgment normal.      Results for orders placed or performed in visit on 02/16/24  CBC with Differential/Platelet  Result Value Ref Range   WBC 5.8 4.0 - 10.5 K/uL   RBC 4.70 3.87 - 5.11 Mil/uL   Hemoglobin 14.8 12.0 - 15.0 g/dL   HCT 62.9 52.8 - 41.3 %   MCV 91.9 78.0 - 100.0 fl   MCHC 34.3 30.0 - 36.0 g/dL   RDW 24.4 01.0 - 27.2 %   Platelets 242.0 150.0 - 400.0 K/uL   Neutrophils Relative % 61.0 43.0 - 77.0 %   Lymphocytes Relative 27.6 12.0 - 46.0 %   Monocytes Relative 6.9 3.0 - 12.0 %   Eosinophils Relative 3.4 0.0 - 5.0 %   Basophils Relative 1.1 0.0 - 3.0 %   Neutro Abs 3.6 1.4 - 7.7 K/uL   Lymphs Abs 1.6 0.7 - 4.0 K/uL   Monocytes Absolute 0.4 0.1 - 1.0 K/uL   Eosinophils Absolute 0.2 0.0 - 0.7 K/uL   Basophils Absolute 0.1 0.0 - 0.1 K/uL  Comprehensive metabolic panel  Result Value Ref Range   Sodium 141 135 - 145 mEq/L   Potassium 4.3 3.5 - 5.1 mEq/L   Chloride 103 96 - 112 mEq/L   CO2 30 19 - 32 mEq/L   Glucose, Bld 102 (H) 70 - 99 mg/dL   BUN 16 6 - 23 mg/dL   Creatinine, Ser 5.36 0.40 - 1.20 mg/dL   Total Bilirubin 0.8 0.2 - 1.2 mg/dL   Alkaline Phosphatase 115 39 - 117 U/L   AST 18 0 - 37 U/L   ALT 16 0 - 35 U/L   Total Protein 7.2 6.0 - 8.3 g/dL   Albumin 4.6 3.5 - 5.2 g/dL   GFR 64.40 >34.74 mL/min   Calcium 9.7 8.4 - 10.5 mg/dL  Lipid panel  Result Value Ref Range   Cholesterol 142 0 - 200 mg/dL   Triglycerides 259.5 0.0 - 149.0 mg/dL   HDL 63.87 >56.43 mg/dL   VLDL 32.9 0.0 - 51.8 mg/dL   LDL Cholesterol 62 0 - 99 mg/dL   Total CHOL/HDL Ratio 3    NonHDL 90.05   TSH  Result Value Ref Range   TSH 0.79 0.35 - 5.50 uIU/mL  VITAMIN D 25 Hydroxy (Vit-D Deficiency, Fractures)  Result Value Ref Range   VITD 52.16 30.00 - 100.00 ng/mL    Last CBC Lab Results  Component Value Date    WBC 5.8 02/16/2024   HGB 14.8 02/16/2024   HCT 43.2 02/16/2024   MCV 91.9 02/16/2024   MCH 30.5 03/25/2021   RDW 12.7 02/16/2024   PLT 242.0 02/16/2024   Last metabolic panel Lab Results  Component Value Date   GLUCOSE 102 (H) 02/16/2024   NA 141 02/16/2024   K 4.3 02/16/2024   CL 103 02/16/2024   CO2 30 02/16/2024   BUN 16 02/16/2024  CREATININE 0.59 02/16/2024   GFR 87.79 02/16/2024   CALCIUM 9.7 02/16/2024   PROT 7.2 02/16/2024   ALBUMIN 4.6 02/16/2024   LABGLOB 2.4 11/27/2020   AGRATIO 1.9 11/27/2020   BILITOT 0.8 02/16/2024   ALKPHOS 115 02/16/2024   AST 18 02/16/2024   ALT 16 02/16/2024   ANIONGAP 10 03/25/2021   Last lipids Lab Results  Component Value Date   CHOL 142 02/16/2024   HDL 52.30 02/16/2024   LDLCALC 62 02/16/2024   TRIG 139.0 02/16/2024   CHOLHDL 3 02/16/2024   Last hemoglobin A1c No results found for: "HGBA1C" Last thyroid functions Lab Results  Component Value Date   TSH 0.79 02/16/2024   Last vitamin D Lab Results  Component Value Date   VD25OH 52.16 02/16/2024   Last vitamin B12 and Folate No results found for: "VITAMINB12", "FOLATE"    The 10-year ASCVD risk score (Arnett DK, et al., 2019) is: 26.7%    Assessment & Plan:   Problem List Items Addressed This Visit       Unprioritized   Vitamin D deficiency   Relevant Orders   VITAMIN D 25 Hydroxy (Vit-D Deficiency, Fractures) (Completed)   Hyperlipidemia   Relevant Orders   CBC with Differential/Platelet (Completed)   Comprehensive metabolic panel (Completed)   Lipid panel (Completed)   TSH (Completed)   VITAMIN D 25 Hydroxy (Vit-D Deficiency, Fractures) (Completed)   Hypertension   Relevant Orders   CBC with Differential/Platelet (Completed)   Comprehensive metabolic panel (Completed)   Lipid panel (Completed)   TSH (Completed)   VITAMIN D 25 Hydroxy (Vit-D Deficiency, Fractures) (Completed)   Allergies   Relevant Medications   loratadine (CLARITIN) 10 MG  tablet   COPD (chronic obstructive pulmonary disease) (HCC)   Stable Symbicort and albuterol        Relevant Medications   loratadine (CLARITIN) 10 MG tablet   OSA (obstructive sleep apnea)   Preventative health care - Primary   Ghm utd Check labs  Health Maintenance  Topic Date Due   DTaP/Tdap/Td (1 - Tdap) Never done   Zoster Vaccines- Shingrix (1 of 2) Never done   Medicare Annual Wellness (AWV)  03/10/2021   Lung Cancer Screening  03/04/2022   COVID-19 Vaccine (3 - 2024-25 season) 08/21/2023   DEXA SCAN  10/07/2024   Pneumonia Vaccine 53+ Years old  Completed   INFLUENZA VACCINE  Completed   Hepatitis C Screening  Completed   HPV VACCINES  Aged Out   Colonoscopy  Discontinued   Fecal DNA (Cologuard)  Discontinued          Other Visit Diagnoses       History of tobacco abuse       Relevant Orders   Ambulatory Referral Lung Cancer Screening [facility]     Assessment and Plan    Annual Wellness Visit During the annual wellness visit, the Cologuard test from last year was negative, and the mammogram showed dense tissue but was otherwise normal. The bone density test was completed. A lung screening in 2023 revealed a spot, but follow-up was missed due to financial constraints. Flu and RSV vaccines were received in September at [facility]. Medical records will be reviewed and updated with the provided documents. A referral to [facility] for lung screening follow-up is necessary. Recommend receiving the shingles vaccine and the Tdap vaccine at the pharmacy downstairs.  Chronic Obstructive Pulmonary Disease (COPD) Symbicort and albuterol are used regularly. Continue both medications as prescribed.  Hypertension Antihypertensive medications are  being taken without specific issues. Continue current medications.  Hyperlipidemia Lipid-lowering medications are being taken without specific issues. Continue current medications.  Anxiety and Depression Anxiety and depression are  well-controlled, with previous exacerbations linked to living with multiple children, which has improved. Continue current medications.  Obstructive Sleep Apnea CPAP is not used due to discomfort and difficulty adjusting, and the implantable device option was declined. Discuss alternative treatments if symptoms persist.  Vitamin D Deficiency Vitamin D supplementation at 5000 IU daily is ongoing. Continue current supplementation.  General Health Maintenance Most vaccinations are up to date, but the shingles vaccine has not been received. Recommend receiving the shingles vaccine and the Tdap vaccine at the pharmacy downstairs.  Follow-up Follow up with [facility] for lung screening. Have the pharmacy call for medication refills if needed.        Return in about 6 months (around 08/15/2024), or if symptoms worsen or fail to improve.    Donato Schultz, DO

## 2024-02-17 ENCOUNTER — Telehealth: Payer: Self-pay | Admitting: Neurology

## 2024-02-17 NOTE — Telephone Encounter (Signed)
 Copied from CRM [id number]. Topic: General - Other >> Feb 17, 2024  3:16 PM Irine Seal wrote: Reason for CRM: Referral #[id number] - CT Chest Lung CA Screen Low Dose W/O CM (Order #[id number])  Patient is scheduled for 03/05/2024 at 10:30 AM at [facility].  The patient stated she cannot drive to that location and cannot make it on the scheduled day. I informed the patient that I could not reschedule this and advised she call that facility directly pt declined, she stated  that she no longer lives in that area and now resides in the [place] [facility] area and "she doesn't know why people are making appts without consulting her first "  patient callback [phone]

## 2024-02-20 ENCOUNTER — Other Ambulatory Visit (HOSPITAL_BASED_OUTPATIENT_CLINIC_OR_DEPARTMENT_OTHER): Payer: Self-pay

## 2024-02-21 NOTE — Telephone Encounter (Signed)
 Appt has been resc to [employer] hp patient aware

## 2024-02-22 ENCOUNTER — Encounter: Payer: Self-pay | Admitting: Family Medicine

## 2024-03-05 ENCOUNTER — Ambulatory Visit: Payer: Medicare Other

## 2024-03-07 ENCOUNTER — Ambulatory Visit (HOSPITAL_BASED_OUTPATIENT_CLINIC_OR_DEPARTMENT_OTHER)
Admission: RE | Admit: 2024-03-07 | Discharge: 2024-03-07 | Disposition: A | Discharge status: Home / Self Care | Source: Ambulatory Visit | Attending: Acute Care | Admitting: Acute Care

## 2024-03-07 DIAGNOSIS — Z122 Encounter for screening for malignant neoplasm of respiratory organs: Secondary | ICD-10-CM | POA: Diagnosis not present

## 2024-03-07 DIAGNOSIS — Z87891 Personal history of nicotine dependence: Secondary | ICD-10-CM | POA: Diagnosis not present

## 2024-04-02 ENCOUNTER — Other Ambulatory Visit: Payer: Self-pay

## 2024-04-02 DIAGNOSIS — F17210 Nicotine dependence, cigarettes, uncomplicated: Secondary | ICD-10-CM

## 2024-04-02 DIAGNOSIS — Z87891 Personal history of nicotine dependence: Secondary | ICD-10-CM

## 2024-04-02 DIAGNOSIS — Z122 Encounter for screening for malignant neoplasm of respiratory organs: Secondary | ICD-10-CM

## 2024-05-22 ENCOUNTER — Ambulatory Visit (INDEPENDENT_AMBULATORY_CARE_PROVIDER_SITE_OTHER)

## 2024-05-22 VITALS — Ht 63.0 in | Wt 192.0 lb

## 2024-05-22 DIAGNOSIS — Z0000 Encounter for general adult medical examination without abnormal findings: Secondary | ICD-10-CM | POA: Diagnosis not present

## 2024-05-22 DIAGNOSIS — Z1231 Encounter for screening mammogram for malignant neoplasm of breast: Secondary | ICD-10-CM

## 2024-05-22 NOTE — Patient Instructions (Addendum)
 Lindsey Webb , Thank you for taking time out of your busy schedule to complete your Annual Wellness Visit with me. I enjoyed our conversation and look forward to speaking with you again next year. I, as well as your care team,  appreciate your ongoing commitment to your health goals. Please review the following plan we discussed and let me know if I can assist you in the future. Your Game plan/ To Do List    Referrals: If you haven't heard from the office you've been referred to, please reach out to them at the phone provided.   Follow up Visits: Next Medicare AWV with our clinical staff: 05/28/25 @ 10:50    Have you seen your provider in the last 6 months (3 months if uncontrolled diabetes)?  Next Office Visit with your provider: 08/16/24 @ 9a  Clinician Recommendations:  Aim for 30 minutes of exercise or brisk walking, 6-8 glasses of water, and 5 servings of fruits and vegetables each day.       This is a list of the screening recommended for you and due dates:  Health Maintenance  Topic Date Due   Zoster (Shingles) Vaccine (1 of 2) Never done   COVID-19 Vaccine (3 - 2024-25 season) 08/21/2023   Mammogram  02/28/2024   Flu Shot  07/20/2024   Screening for Lung Cancer  03/07/2025   Medicare Annual Wellness Visit  05/22/2025   DEXA scan (bone density measurement)  02/17/2028   DTaP/Tdap/Td vaccine (2 - Td or Tdap) 02/15/2034   Pneumonia Vaccine  Completed   Hepatitis C Screening  Completed   HPV Vaccine  Aged Out   Meningitis B Vaccine  Aged Out   Colon Cancer Screening  Discontinued   Cologuard (Stool DNA test)  Discontinued    Advanced directives: (Declined) Advance directive discussed with you today. Even though you declined this today, please call our office should you change your mind, and we can give you the proper paperwork for you to fill out. Advance Care Planning is important because it:  [x]  Makes sure you receive the medical care that is consistent with your values, goals,  and preferences  [x]  It provides guidance to your family and loved ones and reduces their decisional burden about whether or not they are making the right decisions based on your wishes.  Follow the link provided in your after visit summary or read over the paperwork we have mailed to you to help you started getting your Advance Directives in place. If you need assistance in completing these, please reach out to us  so that we can help you!  See attachments for Preventive Care and Fall Prevention Tips.

## 2024-05-22 NOTE — Progress Notes (Signed)
 Subjective:   Lindsey Webb is a 78 y.o. who presents for a Medicare Wellness preventive visit.  As a reminder, Annual Wellness Visits don't include a physical exam, and some assessments may be limited, especially if this visit is performed virtually. We may recommend an in-person follow-up visit with your provider if needed.  Visit Complete: Virtual I connected with  Annamae Barrett on 05/22/24 by a audio enabled telemedicine application and verified that I am speaking with the correct person using two identifiers.  Patient Location: Home  Provider Location: Home Office  I discussed the limitations of evaluation and management by telemedicine. The patient expressed understanding and agreed to proceed.  Vital Signs: Because this visit was a virtual/telehealth visit, some criteria may be missing or patient reported. Any vitals not documented were not able to be obtained and vitals that have been documented are patient reported.    Persons Participating in Visit: Patient.  AWV Questionnaire: No: Patient Medicare AWV questionnaire was not completed prior to this visit.  Cardiac Risk Factors include: advanced age (>22men, >27 women);hypertension     Objective:     Today's Vitals   05/22/24 1054  Weight: 192 lb (87.1 kg)  Height: 5\' 3"  (1.6 m)   Body mass index is 34.01 kg/m.     05/22/2024   11:02 AM 03/25/2021    1:57 PM 03/10/2020    9:57 AM 03/06/2019    8:52 AM 02/28/2018    8:36 AM  Advanced Directives  Does Patient Have a Medical Advance Directive? No No No No No  Does patient want to make changes to medical advance directive?   No - Patient declined    Would patient like information on creating a medical advance directive? No - Patient declined   Yes (MAU/Ambulatory/Procedural Areas - Information given) Yes (MAU/Ambulatory/Procedural Areas - Information given)    Current Medications (verified) Outpatient Encounter Medications as of 05/22/2024  Medication Sig   albuterol   (PROVENTIL  HFA;VENTOLIN  HFA) 108 (90 Base) MCG/ACT inhaler Inhale 1 puff into the lungs every 6 (six) hours as needed for wheezing or shortness of breath.   aspirin EC 81 MG tablet Take 81 mg by mouth daily.   budesonide -formoterol  (SYMBICORT ) 160-4.5 MCG/ACT inhaler Inhale 1 puff into the lungs 2 (two) times daily.   calcium carbonate (OS-CAL) 600 MG TABS tablet Take 600 mg by mouth 2 (two) times daily with a meal.   cholecalciferol (VITAMIN D ) 400 units TABS tablet Take 5,000 Units by mouth daily.   lisinopril  (ZESTRIL ) 30 MG tablet TAKE 1 TABLET BY MOUTH EVERY DAY   loratadine  (CLARITIN ) 10 MG tablet Take 1 tablet (10 mg total) by mouth daily.   simvastatin  (ZOCOR ) 40 MG tablet Take 1 tablet (40 mg total) by mouth daily. Please schedule an office visit before anymore refills.   No facility-administered encounter medications on file as of 05/22/2024.    Allergies (verified) Gabapentin, Latex, and Sodium hypochlorite   History: Past Medical History:  Diagnosis Date   Anxiety    Arthritis    osteoarthritis   COPD (chronic obstructive pulmonary disease) (HCC)    Depression    History of cervical cancer    Hyperlipidemia    Hypertension    Osteopenia    Sleep apnea    Vitamin D  deficiency    Past Surgical History:  Procedure Laterality Date   ABDOMINAL HYSTERECTOMY  1982   CHOLECYSTECTOMY  1981   TONSILLECTOMY  1962   Family History  Problem Relation Age of Onset  Prostate cancer Father    Brain cancer Father    Colon polyps Father    Brain cancer Mother    Brain cancer Paternal Grandmother    Social History   Socioeconomic History   Marital status: Married    Spouse name: Not on file   Number of children: 2   Years of education: Not on file   Highest education level: GED or equivalent  Occupational History   Occupation: retired  Tobacco Use   Smoking status: Former    Current packs/day: 0.00    Average packs/day: 1 pack/day for 45.0 years (45.0 ttl pk-yrs)     Types: Cigarettes    Start date: 01/12/1971    Quit date: 01/13/2016    Years since quitting: 8.3   Smokeless tobacco: Never  Vaping Use   Vaping status: Never Used  Substance and Sexual Activity   Alcohol use: No   Drug use: No   Sexual activity: Yes    Birth control/protection: Post-menopausal, Surgical  Other Topics Concern   Not on file  Social History Narrative   Retired, goes to [employer] --- no    Social Drivers of Corporate investment banker Strain: Low Risk  (05/22/2024)   Overall Financial Resource Strain (CARDIA)    Difficulty of Paying Living Expenses: Not hard at all  Food Insecurity: No Food Insecurity (05/22/2024)   Hunger Vital Sign    Worried About Running Out of Food in the Last Year: Never true    Ran Out of Food in the Last Year: Never true  Transportation Needs: No Transportation Needs (05/22/2024)   PRAPARE - Administrator, Civil Service (Medical): No    Lack of Transportation (Non-Medical): No  Physical Activity: Inactive (05/22/2024)   Exercise Vital Sign    Days of Exercise per Week: 0 days    Minutes of Exercise per Session: 0 min  Stress: No Stress Concern Present (05/22/2024)   [employer] of Occupational Health - Occupational Stress Questionnaire    Feeling of Stress : Not at all  Social Connections: Moderately Isolated (05/22/2024)   Social Connection and Isolation Panel [NHANES]    Frequency of Communication with Friends and Family: More than three times a week    Frequency of Social Gatherings with Friends and Family: More than three times a week    Attends Religious Services: Never    Database administrator or Organizations: No    Attends Engineer, structural: Never    Marital Status: Married    Tobacco Counseling Counseling given: Not Answered    Clinical Intake:  Pre-visit preparation completed: Yes  Pain : No/denies pain     BMI - recorded: 34.01 Nutritional Status: BMI > 30  Obese Nutritional  Risks: None Diabetes: No  No results found for: "HGBA1C"   How often do you need to have someone help you when you read instructions, pamphlets, or other written materials from your doctor or pharmacy?: 1 - Never  Interpreter Needed?: No  Information entered by :: Farris Hong LPN   Activities of Daily Living     05/22/2024   10:59 AM  In your present state of health, do you have any difficulty performing the following activities:  Hearing? 0  Vision? 0  Difficulty concentrating or making decisions? 0  Walking or climbing stairs? 0  Dressing or bathing? 0  Doing errands, shopping? 0  Preparing Food and eating ? N  Using the Toilet?  N  In the past six months, have you accidently leaked urine? Y  Comment Wears Pads. Pending medical attention  Do you have problems with loss of bowel control? N  Managing your Medications? N  Managing your Finances? N  Housekeeping or managing your Housekeeping? N    Patient Care Team: Crecencio Dodge, Candida Chalk, DO as PCP - General (Family Medicine)  I have updated your Care Teams any recent Medical Services you may have received from other providers in the past year.     Assessment:    This is a routine wellness examination for Lavinia.  Hearing/Vision screen Hearing Screening - Comments:: Denies hearing difficulties   Vision Screening - Comments:: Wears rx glasses - up to date with routine eye exams with  [facility]   Goals Addressed               This Visit's Progress     Increase physical activity (pt-stated)        Remain active.       Depression Screen     05/22/2024   10:58 AM 02/16/2024    9:16 AM 11/27/2020    9:44 AM 03/10/2020    9:55 AM 12/10/2019    1:54 PM 08/01/2019    8:57 AM 03/06/2019    8:50 AM  PHQ 2/9 Scores  PHQ - 2 Score 0 1 3 0 3 0 0  PHQ- 9 Score   9  9 0     Fall Risk     05/22/2024   11:01 AM 02/16/2024    9:16 AM 11/27/2020    9:44 AM 03/10/2020    9:57 AM 12/10/2019    1:53 PM  Fall Risk    Falls in the past year? 0 0 0 0 0  Number falls in past yr: 0 0 0 0 0  Injury with Fall? 0 0 0 0 0  Risk for fall due to : No Fall Risks  No Fall Risks    Follow up Falls evaluation completed Falls evaluation completed Falls evaluation completed      MEDICARE RISK AT HOME:  Medicare Risk at Home Any stairs in or around the home?: No If so, are there any without handrails?: Yes Home free of loose throw rugs in walkways, pet beds, electrical cords, etc?: Yes Adequate lighting in your home to reduce risk of falls?: Yes Life alert?: No Use of a cane, walker or w/c?: No Grab bars in the bathroom?: Yes Shower chair or bench in shower?: Yes Elevated toilet seat or a handicapped toilet?: No  TIMED UP AND GO:  Was the test performed?  No  Cognitive Function: 6CIT completed        05/22/2024   11:02 AM 03/10/2020   10:01 AM 03/06/2019    8:52 AM 02/28/2018    8:33 AM  6CIT Screen  What Year? 0 points 0 points 0 points 0 points  What month? 0 points 0 points 0 points 0 points  What time? 0 points 0 points 0 points 0 points  Count back from 20 0 points 0 points 0 points 0 points  Months in reverse 0 points 0 points 0 points 0 points  Repeat phrase 0 points 2 points 0 points 0 points  Total Score 0 points 2 points 0 points 0 points    Immunizations Immunization History  Administered Date(s) Administered   Fluad Quad(high Dose 65+) 11/27/2020, 10/13/2022   Fluzone Influenza virus vaccine,trivalent (IIV3), split virus 10/19/2016  Influenza, High Dose Seasonal PF 10/20/2015, 09/08/2018, 10/07/2019   Influenza-Unspecified 09/20/2015, 10/01/2017, 09/15/2023   Janssen (J&J) SARS-COV-2 Vaccination 08/19/2020, 12/04/2020   PNEUMOCOCCAL CONJUGATE-20 09/07/2022   Pneumococcal Conjugate-13 03/13/2014   Pneumococcal Polysaccharide-23 02/20/2013   Pneumococcal-Unspecified 02/20/2013   Tdap 02/16/2024    Screening Tests Health Maintenance  Topic Date Due   Zoster Vaccines- Shingrix (1  of 2) Never done   COVID-19 Vaccine (3 - 2024-25 season) 08/21/2023   MAMMOGRAM  02/28/2024   INFLUENZA VACCINE  07/20/2024   Lung Cancer Screening  03/07/2025   Medicare Annual Wellness (AWV)  05/22/2025   DEXA SCAN  02/17/2028   DTaP/Tdap/Td (2 - Td or Tdap) 02/15/2034   Pneumonia Vaccine 3+ Years old  Completed   Hepatitis C Screening  Completed   HPV VACCINES  Aged Out   Meningococcal B Vaccine  Aged Out   Colonoscopy  Discontinued   Fecal DNA (Cologuard)  Discontinued    Health Maintenance  Health Maintenance Due  Topic Date Due   Zoster Vaccines- Shingrix (1 of 2) Never done   COVID-19 Vaccine (3 - 2024-25 season) 08/21/2023   MAMMOGRAM  02/28/2024   Health Maintenance Items Addressed: Mammogram ordered  Additional Screening:  Vision Screening: Recommended annual ophthalmology exams for early detection of glaucoma and other disorders of the eye. Would you like a referral to an eye doctor? No    Dental Screening: Recommended annual dental exams for proper oral hygiene  Community Resource Referral / Chronic Care Management: CRR required this visit?  No   CCM required this visit?  No   Plan:    I have personally reviewed and noted the following in the patient's chart:   Medical and social history Use of alcohol, tobacco or illicit drugs  Current medications and supplements including opioid prescriptions. Patient is not currently taking opioid prescriptions. Functional ability and status Nutritional status Physical activity Advanced directives List of other physicians Hospitalizations, surgeries, and ER visits in previous 12 months Vitals Screenings to include cognitive, depression, and falls Referrals and appointments  In addition, I have reviewed and discussed with patient certain preventive protocols, quality metrics, and best practice recommendations. A written personalized care plan for preventive services as well as general preventive health  recommendations were provided to patient.   Dewayne Ford, LPN   4/0/9811   After Visit Summary: (MyChart) Due to this being a telephonic visit, the after visit summary with patients personalized plan was offered to patient via MyChart   Notes: Nothing significant to report at this time.

## 2024-05-24 ENCOUNTER — Ambulatory Visit (INDEPENDENT_AMBULATORY_CARE_PROVIDER_SITE_OTHER): Admitting: Family Medicine

## 2024-05-24 ENCOUNTER — Encounter: Payer: Self-pay | Admitting: Family Medicine

## 2024-05-24 ENCOUNTER — Ambulatory Visit (HOSPITAL_BASED_OUTPATIENT_CLINIC_OR_DEPARTMENT_OTHER)
Admission: RE | Admit: 2024-05-24 | Discharge: 2024-05-24 | Disposition: A | Discharge status: Home / Self Care | Source: Ambulatory Visit | Attending: Family Medicine | Admitting: Family Medicine

## 2024-05-24 VITALS — BP 140/90 | HR 94 | Temp 97.8°F | Resp 18 | Ht 63.0 in | Wt 189.4 lb

## 2024-05-24 DIAGNOSIS — G8929 Other chronic pain: Secondary | ICD-10-CM | POA: Insufficient documentation

## 2024-05-24 DIAGNOSIS — N3941 Urge incontinence: Secondary | ICD-10-CM

## 2024-05-24 DIAGNOSIS — M4725 Other spondylosis with radiculopathy, thoracolumbar region: Secondary | ICD-10-CM | POA: Diagnosis not present

## 2024-05-24 DIAGNOSIS — M4726 Other spondylosis with radiculopathy, lumbar region: Secondary | ICD-10-CM | POA: Diagnosis not present

## 2024-05-24 DIAGNOSIS — M545 Low back pain, unspecified: Secondary | ICD-10-CM | POA: Diagnosis not present

## 2024-05-24 DIAGNOSIS — J42 Unspecified chronic bronchitis: Secondary | ICD-10-CM

## 2024-05-24 LAB — POC URINALSYSI DIPSTICK (AUTOMATED)
Bilirubin, UA: NEGATIVE
Blood, UA: NEGATIVE
Glucose, UA: NEGATIVE
Ketones, UA: NEGATIVE
Leukocytes, UA: NEGATIVE
Nitrite, UA: NEGATIVE
Protein, UA: NEGATIVE
Spec Grav, UA: <=1.005 — AB (ref 1.010–1.025)
Urobilinogen, UA: 0.2 U/dL
pH, UA: 7.5 (ref 5.0–8.0)

## 2024-05-24 MED ORDER — PREDNISONE 10 MG PO TABS: ORAL_TABLET | ORAL | 0 refills | Status: DC

## 2024-05-24 MED ORDER — BUDESONIDE-FORMOTEROL FUMARATE 160-4.5 MCG/ACT IN AERO
1.0000 | INHALATION_SPRAY | Freq: Two times a day (BID) | RESPIRATORY_TRACT | 1 refills | Status: AC
Start: 2024-05-24 — End: ?

## 2024-05-24 MED ORDER — OXYBUTYNIN CHLORIDE ER 5 MG PO TB24: 5.0000 mg | ORAL_TABLET | Freq: Every day | ORAL | 5 refills | Status: DC

## 2024-05-24 MED ORDER — TIZANIDINE HCL 4 MG PO TABS: 4.0000 mg | ORAL_TABLET | Freq: Four times a day (QID) | ORAL | 0 refills | Status: DC | PRN

## 2024-05-24 NOTE — Patient Instructions (Signed)
 Acute Back Pain, Adult Acute back pain is sudden and usually short-lived. It is often caused by an injury to the muscles and tissues in the back. The injury may result from: A muscle, tendon, or ligament getting overstretched or torn. Ligaments are tissues that connect bones to each other. Lifting something improperly can cause a back strain. Wear and tear (degeneration) of the spinal disks. Spinal disks are circular tissue that provide cushioning between the bones of the spine (vertebrae). Twisting motions, such as while playing sports or doing yard work. A hit to the back. Arthritis. You may have a physical exam, lab tests, and imaging tests to find the cause of your pain. Acute back pain usually goes away with rest and home care. Follow these instructions at home: Managing pain, stiffness, and swelling Take over-the-counter and prescription medicines only as told by your health care provider. Treatment may include medicines for pain and inflammation that are taken by mouth or applied to the skin, or muscle relaxants. Your health care provider may recommend applying ice during the first 24-48 hours after your pain starts. To do this: Put ice in a plastic bag. Place a towel between your skin and the bag. Leave the ice on for 20 minutes, 2-3 times a day. Remove the ice if your skin turns bright red. This is very important. If you cannot feel pain, heat, or cold, you have a greater risk of damage to the area. If directed, apply heat to the affected area as often as told by your health care provider. Use the heat source that your health care provider recommends, such as a moist heat pack or a heating pad. Place a towel between your skin and the heat source. Leave the heat on for 20-30 minutes. Remove the heat if your skin turns bright red. This is especially important if you are unable to feel pain, heat, or cold. You have a greater risk of getting burned. Activity  Do not stay in bed. Staying in  bed for more than 1-2 days can delay your recovery. Sit up and stand up straight. Avoid leaning forward when you sit or hunching over when you stand. If you work at a desk, sit close to it so you do not need to lean over. Keep your chin tucked in. Keep your neck drawn back, and keep your elbows bent at a 90-degree angle (right angle). Sit high and close to the steering wheel when you drive. Add lower back (lumbar) support to your car seat, if needed. Take short walks on even surfaces as soon as you are able. Try to increase the length of time you walk each day. Do not sit, drive, or stand in one place for more than 30 minutes at a time. Sitting or standing for long periods of time can put stress on your back. Do not drive or use heavy machinery while taking prescription pain medicine. Use proper lifting techniques. When you bend and lift, use positions that put less stress on your back: [place] your knees. Keep the load close to your body. Avoid twisting. Exercise regularly as told by your health care provider. Exercising helps your back heal faster and helps prevent back injuries by keeping muscles strong and flexible. Work with a physical therapist to make a safe exercise program, as recommended by your health care provider. Do any exercises as told by your physical therapist. Lifestyle Maintain a healthy weight. Extra weight puts stress on your back and makes it difficult to have good  posture. Avoid activities or situations that make you feel anxious or stressed. Stress and anxiety increase muscle tension and can make back pain worse. Learn ways to manage anxiety and stress, such as through exercise. General instructions Sleep on a firm mattress in a comfortable position. Try lying on your side with your knees slightly bent. If you lie on your back, put a pillow under your knees. Keep your head and neck in a straight line with your spine (neutral position) when using electronic equipment like  smartphones or pads. To do this: Raise your smartphone or pad to look at it instead of bending your head or neck to look down. Put the smartphone or pad at the level of your face while looking at the screen. Follow your treatment plan as told by your health care provider. This may include: Cognitive or behavioral therapy. Acupuncture or massage therapy. Meditation or yoga. Contact a health care provider if: You have pain that is not relieved with rest or medicine. You have increasing pain going down into your legs or buttocks. Your pain does not improve after 2 weeks. You have pain at night. You lose weight without trying. You have a fever or chills. You develop nausea or vomiting. You develop abdominal pain. Get help right away if: You develop new bowel or bladder control problems. You have unusual weakness or numbness in your arms or legs. You feel faint. These symptoms may represent a serious problem that is an emergency. Do not wait to see if the symptoms will go away. Get medical help right away. Call your local emergency services (911 in the [place] Do not drive yourself to the hospital. Summary Acute back pain is sudden and usually short-lived. Use proper lifting techniques. When you bend and lift, use positions that put less stress on your back. Take over-the-counter and prescription medicines only as told by your health care provider, and apply heat or ice as told. This information is not intended to replace advice given to you by your health care provider. Make sure you discuss any questions you have with your health care provider. Document Revised: 02/27/2021 Document Reviewed: 02/27/2021 Elsevier Patient Education  2024 [employer].

## 2024-05-24 NOTE — Progress Notes (Signed)
 Established Patient Office Visit  Subjective   Patient ID: Lindsey Webb, female    DOB: December 23, 1945  Age: 78 y.o. MRN: [id number]  Chief Complaint  Patient presents with   Back Pain    X3 weeks, no falls or injuries, lower back, dull ache, hard to walk.     HPI Discussed the use of AI scribe software for clinical note transcription with the patient, who gave verbal consent to proceed.  History of Present Illness Lindsey Webb is a 78 year old female who presents with new onset lower back pain.  She has been experiencing lower back pain since May 7th, following a gym visit. The pain is located in the middle of her lower back and spreads across her hips, without radiating down her legs. It is exacerbated by activities such as walking, sweeping, and vacuuming, making it difficult for her to maintain her usual pace. She denies any recent injuries or changes in her gym routine, noting that she only uses the treadmill without increasing the incline or lifting weights. She has not found relief from any treatments she has tried so far.  In addition to the back pain, she has been experiencing urinary incontinence over the past few weeks. She is unable to make it to the bathroom in time upon waking up around 4:30 to 5:00 AM and sometimes experiences incontinence while asleep, necessitating the use of pads. This issue has worsened recently, although she has experienced similar symptoms in the past when having a cold. No burning sensation during urination is reported.  She requests a refill for her Symbicort  inhaler and mentions that her current allergy medication, loratadine , is no longer effective. She inquires about alternative allergy medications. She reports difficulty with balance when attempting to walk on her toes.   Patient Active Problem List   Diagnosis Date Noted   OSA (obstructive sleep apnea) 02/16/2024   Preventative health care 02/16/2024   Allergies 02/16/2024   Major depressive  disorder with single episode, in partial remission (HCC) 12/12/2019   Atherosclerosis of coronary artery without angina pectoris 03/24/2018   Hypertension 07/12/2017   COPD (chronic obstructive pulmonary disease) (HCC) 07/12/2017   Osteopenia 07/12/2017   Depression 07/12/2017   History of cervical cancer 07/12/2017   Hyperlipidemia 07/12/2017   Vitamin D  deficiency 07/12/2017   Past Medical History:  Diagnosis Date   Anxiety    Arthritis    osteoarthritis   COPD (chronic obstructive pulmonary disease) (HCC)    Depression    History of cervical cancer    Hyperlipidemia    Hypertension    Osteopenia    Sleep apnea    Vitamin D  deficiency    Past Surgical History:  Procedure Laterality Date   ABDOMINAL HYSTERECTOMY  1982   CHOLECYSTECTOMY  1981   TONSILLECTOMY  1962   Social History   Tobacco Use   Smoking status: Former    Current packs/day: 0.00    Average packs/day: 1 pack/day for 45.0 years (45.0 ttl pk-yrs)    Types: Cigarettes    Start date: 01/12/1971    Quit date: 01/13/2016    Years since quitting: 8.3   Smokeless tobacco: Never  Vaping Use   Vaping status: Never Used  Substance Use Topics   Alcohol use: No   Drug use: No   Social History   Socioeconomic History   Marital status: Married    Spouse name: Not on file   Number of children: 2   Years of education: Not  on file   Highest education level: GED or equivalent  Occupational History   Occupation: retired  Tobacco Use   Smoking status: Former    Current packs/day: 0.00    Average packs/day: 1 pack/day for 45.0 years (45.0 ttl pk-yrs)    Types: Cigarettes    Start date: 01/12/1971    Quit date: 01/13/2016    Years since quitting: 8.3   Smokeless tobacco: Never  Vaping Use   Vaping status: Never Used  Substance and Sexual Activity   Alcohol use: No   Drug use: No   Sexual activity: Yes    Birth control/protection: Post-menopausal, Surgical  Other Topics Concern   Not on file  Social  History Narrative   Retired, goes to [employer] --- no    Social Drivers of Corporate investment banker Strain: Low Risk  (05/22/2024)   Overall Financial Resource Strain (CARDIA)    Difficulty of Paying Living Expenses: Not hard at all  Food Insecurity: No Food Insecurity (05/22/2024)   Hunger Vital Sign    Worried About Running Out of Food in the Last Year: Never true    Ran Out of Food in the Last Year: Never true  Transportation Needs: No Transportation Needs (05/22/2024)   PRAPARE - Administrator, Civil Service (Medical): No    Lack of Transportation (Non-Medical): No  Physical Activity: Inactive (05/22/2024)   Exercise Vital Sign    Days of Exercise per Week: 0 days    Minutes of Exercise per Session: 0 min  Stress: No Stress Concern Present (05/22/2024)   [employer] of Occupational Health - Occupational Stress Questionnaire    Feeling of Stress : Not at all  Social Connections: Moderately Isolated (05/22/2024)   Social Connection and Isolation Panel [NHANES]    Frequency of Communication with Friends and Family: More than three times a week    Frequency of Social Gatherings with Friends and Family: More than three times a week    Attends Religious Services: Never    Database administrator or Organizations: No    Attends Banker Meetings: Never    Marital Status: Married  Catering manager Violence: Not At Risk (05/22/2024)   Humiliation, Afraid, Rape, and Kick questionnaire    Fear of Current or Ex-Partner: No    Emotionally Abused: No    Physically Abused: No    Sexually Abused: No   Family Status  Relation Name Status   Father  Deceased at age 16   Mother  Deceased at age 41   PGM  (Not Specified)  No partnership data on file   Family History  Problem Relation Age of Onset   Prostate cancer Father    Brain cancer Father    Colon polyps Father    Brain cancer Mother    Brain cancer Paternal Grandmother    Allergies  Allergen  Reactions   Gabapentin Itching   Latex Itching and Rash   Sodium Hypochlorite Itching and Rash    Clorox      Review of Systems  Constitutional:  Negative for fever and malaise/fatigue.  HENT:  Negative for congestion.   Eyes:  Negative for blurred vision.  Respiratory:  Negative for cough and shortness of breath.   Cardiovascular:  Negative for chest pain, palpitations and leg swelling.  Gastrointestinal:  Negative for vomiting.  Musculoskeletal:  Negative for back pain.  Skin:  Negative for rash.  Neurological:  Negative for loss  of consciousness and headaches.      Objective:     BP (!) 140/90 (BP Location: Left Arm, Patient Position: Sitting, Cuff Size: Large)   Pulse 94   Temp 97.8 F (36.6 C) (Oral)   Resp 18   Ht 5\' 3"  (1.6 m)   Wt 189 lb 6.4 oz (85.9 kg)   SpO2 97%   BMI 33.55 kg/m  BP Readings from Last 3 Encounters:  05/24/24 (!) 140/90  02/16/24 (!) 140/90  03/25/21 (!) 143/73   Wt Readings from Last 3 Encounters:  05/24/24 189 lb 6.4 oz (85.9 kg)  05/22/24 192 lb (87.1 kg)  02/16/24 192 lb (87.1 kg)   SpO2 Readings from Last 3 Encounters:  05/24/24 97%  02/16/24 96%  03/25/21 96%      Physical Exam Vitals and nursing note reviewed.  Constitutional:      General: She is not in acute distress.    Appearance: Normal appearance. She is well-developed.  HENT:     Head: Normocephalic and atraumatic.  Eyes:     General: No scleral icterus.       Right eye: No discharge.        Left eye: No discharge.  Cardiovascular:     Rate and Rhythm: Normal rate and regular rhythm.     Heart sounds: No murmur heard. Pulmonary:     Effort: Pulmonary effort is normal. No respiratory distress.     Breath sounds: Normal breath sounds.  Musculoskeletal:     Cervical back: Normal range of motion and neck supple.     Lumbar back: Spasms and tenderness present. Decreased range of motion. Positive right straight leg raise test and positive left straight leg raise  test.     Right lower leg: No edema.     Left lower leg: No edema.  Skin:    General: Skin is warm and dry.  Neurological:     Mental Status: She is alert and oriented to person, place, and time.  Psychiatric:        Mood and Affect: Mood normal.        Behavior: Behavior normal.        Thought Content: Thought content normal.        Judgment: Judgment normal.      No results found for any visits on 05/24/24.  Last CBC Lab Results  Component Value Date   WBC 5.8 02/16/2024   HGB 14.8 02/16/2024   HCT 43.2 02/16/2024   MCV 91.9 02/16/2024   MCH 30.5 03/25/2021   RDW 12.7 02/16/2024   PLT 242.0 02/16/2024   Last metabolic panel Lab Results  Component Value Date   GLUCOSE 102 (H) 02/16/2024   NA 141 02/16/2024   K 4.3 02/16/2024   CL 103 02/16/2024   CO2 30 02/16/2024   BUN 16 02/16/2024   CREATININE 0.59 02/16/2024   GFR 87.79 02/16/2024   CALCIUM 9.7 02/16/2024   PROT 7.2 02/16/2024   ALBUMIN 4.6 02/16/2024   LABGLOB 2.4 11/27/2020   AGRATIO 1.9 11/27/2020   BILITOT 0.8 02/16/2024   ALKPHOS 115 02/16/2024   AST 18 02/16/2024   ALT 16 02/16/2024   ANIONGAP 10 03/25/2021   Last lipids Lab Results  Component Value Date   CHOL 142 02/16/2024   HDL 52.30 02/16/2024   LDLCALC 62 02/16/2024   TRIG 139.0 02/16/2024   CHOLHDL 3 02/16/2024   Last hemoglobin A1c No results found for: "HGBA1C" Last thyroid  functions Lab Results  Component  Value Date   TSH 0.79 02/16/2024   Last vitamin D  Lab Results  Component Value Date   VD25OH 52.16 02/16/2024   Last vitamin B12 and Folate No results found for: "VITAMINB12", "FOLATE"    The 10-year ASCVD risk score (Arnett DK, et al., 2019) is: 26.7%    Assessment & Plan:   Problem List Items Addressed This Visit       Unprioritized   COPD (chronic obstructive pulmonary disease) (HCC)   Relevant Medications   budesonide -formoterol  (SYMBICORT ) 160-4.5 MCG/ACT inhaler   predniSONE  (DELTASONE ) 10 MG tablet    Other Relevant Orders   AMB Referral VBCI Care Management   Other Visit Diagnoses       Chronic midline low back pain without sciatica    -  Primary   Relevant Medications   tiZANidine (ZANAFLEX) 4 MG tablet   oxybutynin (DITROPAN-XL) 5 MG 24 hr tablet   predniSONE  (DELTASONE ) 10 MG tablet   Other Relevant Orders   DG Lumbar Spine Complete     Urge incontinence of urine       Relevant Medications   oxybutynin (DITROPAN-XL) 5 MG 24 hr tablet   Other Relevant Orders   POCT Urinalysis Dipstick (Automated)     Assessment and Plan Assessment & Plan Acute Low Back Pain   She experienced acute low back pain after gym activity on May 7th, localized to the lower back and spreading across the hips, without leg radiation. Pain worsens with walking and bending. Examination shows tenderness and spasms in the lower back. Differential diagnosis includes musculoskeletal strain or sprain. No urinary tract infection or neurological deficits are present. Treatment options discussed include a prednisone  taper and a muscle relaxer, with the latter advised for use at night or when at home due to sedation. Prescribe a prednisone  taper for inflammation and a muscle relaxer for spasms. Order a lumbar spine x-ray to assess disc spaces.  Urinary Incontinence   She has recent onset urinary incontinence, especially in the morning and sometimes during sleep, with urgency issues. Symptoms have persisted for weeks. Differential diagnosis includes age-related changes or secondary to back issues. Discussed Kegel exercises for sphincter muscle strengthening and considered medication for bladder control, noting side effects like dry mouth and eyes. Perform urinalysis to rule out infection. Prescribe bladder control medication and educate on Kegel exercises.  Asthma   She needs a Symbicort  inhaler refill for asthma management. Current medication is not covered by Medicare Advantage coupons, creating a financial burden.  Discussed exploring reduced-cost options for Symbicort  or alternatives through pharmacist consultation. Refill Symbicort  inhaler prescription and consult with a pharmacist for cost-effective options.  Allergic Rhinitis   Her current allergy medication, loratadine , is ineffective. Discussed alternative over-the-counter options such as Zyrtec or Xyzal. Recommend trying Zyrtec or Xyzal for allergy management.  General Health Maintenance   She received a Tdap vaccination as previously recommended.    Return if symptoms worsen or fail to improve.    Kynli Chou R Lowne Chase, DO

## 2024-05-31 ENCOUNTER — Ambulatory Visit: Payer: Self-pay | Admitting: Family Medicine

## 2024-06-11 ENCOUNTER — Telehealth: Payer: Self-pay

## 2024-06-11 NOTE — Progress Notes (Signed)
 Complex Care Management Note Care Guide Note  06/11/2024 Name: Lindsey Webb MRN: [id number] DOB: 1946/05/28   Complex Care Management Outreach Attempts: An unsuccessful telephone outreach was attempted today to offer the patient information about available complex care management services.  Follow Up Plan:  Additional outreach attempts will be made to offer the patient complex care management information and services.   Encounter Outcome:  No Answer  [facility]ream[facility]  [facility], [facility] Health Care Management Assistant Direct Dial: [phone]  Fax: [phone]

## 2024-06-18 NOTE — Progress Notes (Unsigned)
 Complex Care Management Note Care Guide Note  06/18/2024 Name: Lindsey Webb MRN: [id number] DOB: 1946/03/24   Complex Care Management Outreach Attempts: A second unsuccessful outreach was attempted today to offer the patient with information about available complex care management services.  Follow Up Plan:  Additional outreach attempts will be made to offer the patient complex care management information and services.   Encounter Outcome:  No Answer  Dreama [facility]  [facility], [facility] Health Care Management Assistant Direct Dial: [phone]  Fax: [phone]

## 2024-06-21 NOTE — Progress Notes (Signed)
 Complex Care Management Note Care Guide Note  06/21/2024 Name: Lindsey Webb MRN: [id number] DOB: 07-08-1946   Complex Care Management Outreach Attempts: A third unsuccessful outreach was attempted today to offer the patient with information about available complex care management services.  Follow Up Plan:  No further outreach attempts will be made at this time. We have been unable to contact the patient to offer or enroll patient in complex care management services.  Encounter Outcome:  No Answer  Dreama [facility]  [facility], [facility] Health Care Management Assistant Direct Dial: [phone]  Fax: [phone]

## 2024-08-16 ENCOUNTER — Encounter: Payer: Self-pay | Admitting: Family Medicine

## 2024-08-16 ENCOUNTER — Ambulatory Visit (INDEPENDENT_AMBULATORY_CARE_PROVIDER_SITE_OTHER): Payer: Medicare Other | Admitting: Family Medicine

## 2024-08-16 VITALS — BP 120/62 | HR 78 | Temp 98.1°F | Resp 18 | Ht 63.0 in | Wt 187.6 lb

## 2024-08-16 DIAGNOSIS — Z87891 Personal history of nicotine dependence: Secondary | ICD-10-CM

## 2024-08-16 DIAGNOSIS — G4733 Obstructive sleep apnea (adult) (pediatric): Secondary | ICD-10-CM

## 2024-08-16 DIAGNOSIS — E785 Hyperlipidemia, unspecified: Secondary | ICD-10-CM | POA: Diagnosis not present

## 2024-08-16 DIAGNOSIS — I10 Essential (primary) hypertension: Secondary | ICD-10-CM | POA: Diagnosis not present

## 2024-08-16 DIAGNOSIS — E559 Vitamin D deficiency, unspecified: Secondary | ICD-10-CM | POA: Diagnosis not present

## 2024-08-16 DIAGNOSIS — J42 Unspecified chronic bronchitis: Secondary | ICD-10-CM | POA: Diagnosis not present

## 2024-08-16 LAB — LIPID PANEL
Cholesterol: 145 mg/dL (ref 0–200)
HDL: 49.5 mg/dL (ref >39.00)
LDL Cholesterol: 60 mg/dL (ref 0–99)
NonHDL: 95.04
Total CHOL/HDL Ratio: 3
Triglycerides: 174 mg/dL — ABNORMAL HIGH (ref 0.0–149.0)
VLDL: 34.8 mg/dL (ref 0.0–40.0)

## 2024-08-16 LAB — CBC WITH DIFFERENTIAL/PLATELET
Basophils Absolute: 0.1 K/uL (ref 0.0–0.1)
Basophils Relative: 1.2 % (ref 0.0–3.0)
Eosinophils Absolute: 0.5 K/uL (ref 0.0–0.7)
Eosinophils Relative: 7.1 % — ABNORMAL HIGH (ref 0.0–5.0)
HCT: 41.8 % (ref 36.0–46.0)
Hemoglobin: 14.1 g/dL (ref 12.0–15.0)
Lymphocytes Relative: 29.9 % (ref 12.0–46.0)
Lymphs Abs: 1.9 K/uL (ref 0.7–4.0)
MCHC: 33.8 g/dL (ref 30.0–36.0)
MCV: 92.1 fl (ref 78.0–100.0)
Monocytes Absolute: 0.4 K/uL (ref 0.1–1.0)
Monocytes Relative: 6.6 % (ref 3.0–12.0)
Neutro Abs: 3.5 K/uL (ref 1.4–7.7)
Neutrophils Relative %: 55.2 % (ref 43.0–77.0)
Platelets: 225 K/uL (ref 150.0–400.0)
RBC: 4.53 Mil/uL (ref 3.87–5.11)
RDW: 12.7 % (ref 11.5–15.5)
WBC: 6.4 K/uL (ref 4.0–10.5)

## 2024-08-16 LAB — COMPREHENSIVE METABOLIC PANEL WITH GFR
ALT: 15 U/L (ref 0–35)
AST: 18 U/L (ref 0–37)
Albumin: 4.3 g/dL (ref 3.5–5.2)
Alkaline Phosphatase: 118 U/L — ABNORMAL HIGH (ref 39–117)
BUN: 14 mg/dL (ref 6–23)
CO2: 31 meq/L (ref 19–32)
Calcium: 9.7 mg/dL (ref 8.4–10.5)
Chloride: 103 meq/L (ref 96–112)
Creatinine, Ser: 0.62 mg/dL (ref 0.40–1.20)
GFR: 86.44 mL/min (ref >60.00)
Glucose, Bld: 100 mg/dL — ABNORMAL HIGH (ref 70–99)
Potassium: 4.7 meq/L (ref 3.5–5.1)
Sodium: 142 meq/L (ref 135–145)
Total Bilirubin: 0.6 mg/dL (ref 0.2–1.2)
Total Protein: 6.5 g/dL (ref 6.0–8.3)

## 2024-08-16 NOTE — Progress Notes (Signed)
 Subjective:    Patient ID: Lindsey Webb, female    DOB: 11/15/1946, 78 y.o.   MRN: [id number]  Chief Complaint  Patient presents with   Follow-up    HPI Patient is in today for f/u.  Discussed the use of AI scribe software for clinical note transcription with the patient, who gave verbal consent to proceed.  History of Present Illness Lindsey Webb is a 78 year old female who presents with back pain and ankle swelling.  She experiences persistent back pain, which is exacerbated by lifting her handicapped son's wheelchair. The wheelchair is heavy, and she is awaiting approval from Medicare for a lighter model.  She also has swelling in her ankle, which she injured several years ago. The swelling varies in severity and sometimes becomes so painful that she cannot walk on it, necessitating the use of a brace. The swelling worsens at the end of the day or after prolonged activity. An x-ray was performed in 2021.  She is currently on medication for blood pressure and cholesterol management. Her blood pressure was previously recorded at 180/100 before starting medication. No swelling in the legs, except for the ankle, and no issues with breathing.    Past Medical History:  Diagnosis Date   Anxiety    Arthritis    osteoarthritis   COPD (chronic obstructive pulmonary disease) (HCC)    Depression    History of cervical cancer    Hyperlipidemia    Hypertension    Osteopenia    Sleep apnea    Vitamin D  deficiency     Past Surgical History:  Procedure Laterality Date   ABDOMINAL HYSTERECTOMY  1982   CHOLECYSTECTOMY  1981   TONSILLECTOMY  1962    Family History  Problem Relation Age of Onset   Prostate cancer Father    Brain cancer Father    Colon polyps Father    Brain cancer Mother    Brain cancer Paternal Grandmother     Social History   Socioeconomic History   Marital status: Married    Spouse name: Not on file   Number of children: 2   Years of education: Not on file    Highest education level: GED or equivalent  Occupational History   Occupation: retired  Tobacco Use   Smoking status: Former    Current packs/day: 0.00    Average packs/day: 1 pack/day for 45.0 years (45.0 ttl pk-yrs)    Types: Cigarettes    Start date: 01/12/1971    Quit date: 01/13/2016    Years since quitting: 8.5   Smokeless tobacco: Never  Vaping Use   Vaping status: Never Used  Substance and Sexual Activity   Alcohol use: No   Drug use: No   Sexual activity: Yes    Birth control/protection: Post-menopausal, Surgical  Other Topics Concern   Not on file  Social History Narrative   Retired, goes to [employer] --- no    Social Drivers of Corporate investment banker Strain: Low Risk  (08/14/2024)   Overall Financial Resource Strain (CARDIA)    Difficulty of Paying Living Expenses: Not very hard  Food Insecurity: No Food Insecurity (08/14/2024)   Hunger Vital Sign    Worried About Running Out of Food in the Last Year: Never true    Ran Out of Food in the Last Year: Never true  Transportation Needs: No Transportation Needs (08/14/2024)   PRAPARE - Administrator, Civil Service (Medical): No  Lack of Transportation (Non-Medical): No  Physical Activity: Insufficiently Active (08/14/2024)   Exercise Vital Sign    Days of Exercise per Week: 3 days    Minutes of Exercise per Session: 30 min  Stress: Stress Concern Present (08/14/2024)   [employer] of Occupational Health - Occupational Stress Questionnaire    Feeling of Stress: To some extent  Social Connections: Unknown (08/14/2024)   Social Connection and Isolation Panel    Frequency of Communication with Friends and Family: Never    Frequency of Social Gatherings with Friends and Family: Patient declined    Attends Religious Services: More than 4 times per year    Active Member of [employer] or Organizations: No    Attends Engineer, structural: Not on file    Marital Status: Married  Recent  Concern: Social Connections - Moderately Isolated (05/22/2024)   Social Connection and Isolation Panel    Frequency of Communication with Friends and Family: More than three times a week    Frequency of Social Gatherings with Friends and Family: More than three times a week    Attends Religious Services: Never    Database administrator or Organizations: No    Attends Banker Meetings: Never    Marital Status: Married  Catering manager Violence: Not At Risk (05/22/2024)   Humiliation, Afraid, Rape, and Kick questionnaire    Fear of Current or Ex-Partner: No    Emotionally Abused: No    Physically Abused: No    Sexually Abused: No    Outpatient Medications Prior to Visit  Medication Sig Dispense Refill   albuterol  (PROVENTIL  HFA;VENTOLIN  HFA) 108 (90 Base) MCG/ACT inhaler Inhale 1 puff into the lungs every 6 (six) hours as needed for wheezing or shortness of breath. 1 Inhaler 2   aspirin EC 81 MG tablet Take 81 mg by mouth daily.     budesonide -formoterol  (SYMBICORT ) 160-4.5 MCG/ACT inhaler Inhale 1 puff into the lungs 2 (two) times daily. 3 each 1   calcium carbonate (OS-CAL) 600 MG TABS tablet Take 600 mg by mouth 2 (two) times daily with a meal.     cholecalciferol (VITAMIN D ) 400 units TABS tablet Take 5,000 Units by mouth daily.     lisinopril  (ZESTRIL ) 30 MG tablet TAKE 1 TABLET BY MOUTH EVERY DAY 30 tablet 0   simvastatin  (ZOCOR ) 40 MG tablet Take 1 tablet (40 mg total) by mouth daily. Please schedule an office visit before anymore refills. 90 tablet 1   tiZANidine  (ZANAFLEX ) 4 MG tablet Take 1 tablet (4 mg total) by mouth every 6 (six) hours as needed for muscle spasms. 30 tablet 0   loratadine  (CLARITIN ) 10 MG tablet Take 1 tablet (10 mg total) by mouth daily.     oxybutynin  (DITROPAN -XL) 5 MG 24 hr tablet Take 1 tablet (5 mg total) by mouth at bedtime. 30 tablet 5   predniSONE  (DELTASONE ) 10 MG tablet TAKE 3 TABLETS PO QD FOR 3 DAYS THEN TAKE 2 TABLETS PO QD FOR 3 DAYS  THEN TAKE 1 TABLET PO QD FOR 3 DAYS THEN TAKE 1/2 TAB PO QD FOR 3 DAYS 20 tablet 0   No facility-administered medications prior to visit.    Allergies  Allergen Reactions   Gabapentin Itching   Latex Itching and Rash   Sodium Hypochlorite Itching and Rash    Clorox    Review of Systems  Constitutional:  Negative for fever and malaise/fatigue.  HENT:  Negative for congestion.   Eyes:  Negative  for blurred vision.  Respiratory:  Negative for cough and shortness of breath.   Cardiovascular:  Negative for chest pain, palpitations and leg swelling.  Gastrointestinal:  Negative for vomiting.  Musculoskeletal:  Negative for back pain.  Skin:  Negative for rash.  Neurological:  Negative for Webb of consciousness and headaches.       Objective:    Physical Exam Vitals and nursing note reviewed.  Constitutional:      General: She is not in acute distress.    Appearance: Normal appearance. She is well-developed.  HENT:     Head: Normocephalic and atraumatic.  Eyes:     General: No scleral icterus.       Right eye: No discharge.        Left eye: No discharge.  Cardiovascular:     Rate and Rhythm: Normal rate and regular rhythm.     Heart sounds: No murmur heard. Pulmonary:     Effort: Pulmonary effort is normal. No respiratory distress.     Breath sounds: Normal breath sounds.  Musculoskeletal:        General: Normal range of motion.     Cervical back: Normal range of motion and neck supple.     Right lower leg: No edema.     Left lower leg: No edema.  Skin:    General: Skin is warm and dry.  Neurological:     Mental Status: She is alert and oriented to person, place, and time.  Psychiatric:        Mood and Affect: Mood normal.        Behavior: Behavior normal.        Thought Content: Thought content normal.        Judgment: Judgment normal.     BP 120/62 (BP Location: Left Arm, Patient Position: Sitting, Cuff Size: Large)   Pulse 78   Temp 98.1 F (36.7 C) (Oral)    Resp 18   Ht 5' 3 (1.6 m)   Wt 187 lb 9.6 oz (85.1 kg)   SpO2 97%   BMI 33.23 kg/m  Wt Readings from Last 3 Encounters:  08/16/24 187 lb 9.6 oz (85.1 kg)  05/24/24 189 lb 6.4 oz (85.9 kg)  05/22/24 192 lb (87.1 kg)    Diabetic Foot Exam - Simple   No data filed    Lab Results  Component Value Date   WBC 5.8 02/16/2024   HGB 14.8 02/16/2024   HCT 43.2 02/16/2024   PLT 242.0 02/16/2024   GLUCOSE 102 (H) 02/16/2024   CHOL 142 02/16/2024   TRIG 139.0 02/16/2024   HDL 52.30 02/16/2024   LDLCALC 62 02/16/2024   ALT 16 02/16/2024   AST 18 02/16/2024   NA 141 02/16/2024   K 4.3 02/16/2024   CL 103 02/16/2024   CREATININE 0.59 02/16/2024   BUN 16 02/16/2024   CO2 30 02/16/2024   TSH 0.79 02/16/2024    Lab Results  Component Value Date   TSH 0.79 02/16/2024   Lab Results  Component Value Date   WBC 5.8 02/16/2024   HGB 14.8 02/16/2024   HCT 43.2 02/16/2024   MCV 91.9 02/16/2024   PLT 242.0 02/16/2024   Lab Results  Component Value Date   NA 141 02/16/2024   K 4.3 02/16/2024   CO2 30 02/16/2024   GLUCOSE 102 (H) 02/16/2024   BUN 16 02/16/2024   CREATININE 0.59 02/16/2024   BILITOT 0.8 02/16/2024   ALKPHOS 115 02/16/2024   AST 18  02/16/2024   ALT 16 02/16/2024   PROT 7.2 02/16/2024   ALBUMIN 4.6 02/16/2024   CALCIUM 9.7 02/16/2024   ANIONGAP 10 03/25/2021   GFR 87.79 02/16/2024   Lab Results  Component Value Date   CHOL 142 02/16/2024   Lab Results  Component Value Date   HDL 52.30 02/16/2024   Lab Results  Component Value Date   LDLCALC 62 02/16/2024   Lab Results  Component Value Date   TRIG 139.0 02/16/2024   Lab Results  Component Value Date   CHOLHDL 3 02/16/2024   No results found for: HGBA1C     Assessment & Plan:  History of tobacco abuse  Primary hypertension Assessment & Plan: Well controlled, no changes to meds. Encouraged heart healthy diet such as the DASH diet and exercise as tolerated.    Orders: -     CBC  with Differential/Platelet  Hyperlipidemia, unspecified hyperlipidemia type Assessment & Plan: Tolerating statin, encouraged heart healthy diet, avoid trans fats, minimize simple carbs and saturated fats. Increase exercise as tolerated   Orders: -     Comprehensive metabolic panel with GFR -     Lipid panel  OSA (obstructive sleep apnea)  Vitamin D  deficiency  Chronic bronchitis, unspecified chronic bronchitis type (HCC) Assessment & Plan: Stable  con't inhalers    Assessment and Plan Assessment & Plan Chronic low back pain   Her chronic low back pain is exacerbated by lifting her son's wheelchair, likely mechanical due to physical strain from lifting heavy objects.  Chronic right ankle pain and swelling, likely osteoarthritis   Chronic right ankle pain and swelling, likely due to osteoarthritis, worsens with prolonged activity and by the end of the day. She uses an ankle brace for support during severe pain and swelling.  Essential hypertension   Her essential hypertension is well-controlled with the current medication regimen, significantly reducing the risk of heart attack and stroke. Continue current blood pressure medication regimen.  Hyperlipidemia   Hyperlipidemia is well-managed with current medication, effectively lowering cholesterol levels and reducing the risk of cardiovascular events. Check cholesterol levels today.  General Health Maintenance   She inquired about [facility] genetic testing for cancer risk. She has not yet received her flu shot, available in September, and plans to schedule her mammogram at the clinic. Consider [facility] genetic testing for cancer risk. Receive flu shot when available in September. Schedule mammogram at the clinic.    Dayzha Pogosyan R Lowne Chase, DO

## 2024-08-16 NOTE — Assessment & Plan Note (Signed)
 Tolerating statin, encouraged heart healthy diet, avoid trans fats, minimize simple carbs and saturated fats. Increase exercise as tolerated

## 2024-08-16 NOTE — Assessment & Plan Note (Signed)
Stable  con't inhalers 

## 2024-08-16 NOTE — Assessment & Plan Note (Signed)
 Well controlled, no changes to meds. Encouraged heart healthy diet such as the DASH diet and exercise as tolerated.

## 2024-08-20 ENCOUNTER — Ambulatory Visit: Payer: Self-pay | Admitting: Family Medicine

## 2024-08-29 ENCOUNTER — Ambulatory Visit (HOSPITAL_BASED_OUTPATIENT_CLINIC_OR_DEPARTMENT_OTHER)
Admission: RE | Admit: 2024-08-29 | Discharge: 2024-08-29 | Disposition: A | Discharge status: Home / Self Care | Source: Ambulatory Visit | Attending: Family Medicine | Admitting: Family Medicine

## 2024-08-29 ENCOUNTER — Encounter (HOSPITAL_BASED_OUTPATIENT_CLINIC_OR_DEPARTMENT_OTHER): Payer: Self-pay

## 2024-08-29 DIAGNOSIS — Z1231 Encounter for screening mammogram for malignant neoplasm of breast: Secondary | ICD-10-CM | POA: Diagnosis not present

## 2024-09-12 ENCOUNTER — Other Ambulatory Visit: Payer: Self-pay

## 2024-09-24 ENCOUNTER — Other Ambulatory Visit: Payer: Self-pay

## 2024-11-07 ENCOUNTER — Ambulatory Visit: Payer: Self-pay

## 2024-11-07 NOTE — Telephone Encounter (Signed)
 This RN attempted to contact pt. Recording stated Call cannot be completed as dialed. Dialed twice and confirmed number as listed.  Copied from CRM #[id number]. Topic: Clinical - Red Word Triage >> Nov 07, 2024  8:37 AM Lindsey Webb wrote: Red Word that prompted transfer to Nurse Triage: Pain in right upper arm and it won't go away, constantly in pain. It's been several weeks.

## 2024-11-07 NOTE — Telephone Encounter (Signed)
 2nd attempt to reach patient. Recording states Call cannot be completed as dialed. Routing to office.

## 2024-11-08 NOTE — Telephone Encounter (Signed)
 Patient stated she has had pain in her right upper arm, near shoulder to middle of arm Sharp pain when lifting her arm upward, feels like it could be muscular related. Has been present for several weeks.  Tx: Tylenol and rest.  Patient was able to be scheduled for Monday 11/12/24 @ 2:40pm with Dr. Antonio Meth

## 2024-11-12 ENCOUNTER — Ambulatory Visit: Admitting: Family Medicine

## 2024-11-12 ENCOUNTER — Other Ambulatory Visit (HOSPITAL_BASED_OUTPATIENT_CLINIC_OR_DEPARTMENT_OTHER): Payer: Self-pay

## 2024-11-12 ENCOUNTER — Encounter: Payer: Self-pay | Admitting: Family Medicine

## 2024-11-12 VITALS — BP 126/88 | HR 84 | Temp 98.1°F | Resp 18 | Ht 63.0 in | Wt 189.2 lb

## 2024-11-12 DIAGNOSIS — Z23 Encounter for immunization: Secondary | ICD-10-CM | POA: Diagnosis not present

## 2024-11-12 DIAGNOSIS — G8929 Other chronic pain: Secondary | ICD-10-CM

## 2024-11-12 DIAGNOSIS — M25511 Pain in right shoulder: Secondary | ICD-10-CM

## 2024-11-12 DIAGNOSIS — M545 Low back pain, unspecified: Secondary | ICD-10-CM

## 2024-11-12 MED ORDER — TIZANIDINE HCL 4 MG PO TABS
4.0000 mg | ORAL_TABLET | Freq: Four times a day (QID) | ORAL | 0 refills | Status: AC | PRN
  Filled 2024-11-12: qty 30, 8d supply, fill #0

## 2024-11-12 MED ORDER — PREDNISONE 10 MG PO TABS
ORAL_TABLET | ORAL | 0 refills | Status: AC
  Filled 2024-11-12: qty 20, 12d supply, fill #0

## 2024-11-12 NOTE — Progress Notes (Signed)
 Subjective:    Patient ID: Lindsey Webb, female    DOB: 1946-06-19, 78 y.o.   MRN: [id number]  Chief Complaint  Patient presents with   Arm Pain    Right arm, no falls or injuries, going on for several weeks.    HPI Patient is in today for r arm pain.  Discussed the use of AI scribe software for clinical note transcription with the patient, who gave verbal consent to proceed.  History of Present Illness Lindsey Webb is a 78 year old female who presents with right shoulder and bicep pain.  She has been experiencing pain in her right shoulder extending down to her bicep for several weeks. The pain began after performing chair exercises at home using three-pound weights, and she is unsure if a muscle was pulled during these exercises.  The pain is severe enough to prevent her from performing daily activities such as picking up a coffee pot or removing her shirt. It is exacerbated by lifting her arm and attempting to reach behind her.  She has been taking Tylenol for the pain, but it has not provided relief. She does not have muscle relaxers at home and has not used any other medications for this issue. In the past, she has used tizanidine  for back pain, which she found helpful.  No history of falls or injuries.    Past Medical History:  Diagnosis Date   Anxiety    Arthritis    osteoarthritis   COPD (chronic obstructive pulmonary disease) (HCC)    Depression    History of cervical cancer    Hyperlipidemia    Hypertension    Osteopenia    Sleep apnea    Vitamin D  deficiency     Past Surgical History:  Procedure Laterality Date   ABDOMINAL HYSTERECTOMY  1982   CHOLECYSTECTOMY  1981   TONSILLECTOMY  1962    Family History  Problem Relation Age of Onset   Prostate cancer Father    Brain cancer Father    Colon polyps Father    Brain cancer Mother    Brain cancer Paternal Grandmother     Social History   Socioeconomic History   Marital status: Married    Spouse name:  Not on file   Number of children: 2   Years of education: Not on file   Highest education level: GED or equivalent  Occupational History   Occupation: retired  Tobacco Use   Smoking status: Former    Current packs/day: 0.00    Average packs/day: 1 pack/day for 45.0 years (45.0 ttl pk-yrs)    Types: Cigarettes    Start date: 01/12/1971    Quit date: 01/13/2016    Years since quitting: 8.8   Smokeless tobacco: Never  Vaping Use   Vaping status: Never Used  Substance and Sexual Activity   Alcohol use: No   Drug use: No   Sexual activity: Yes    Birth control/protection: Post-menopausal, Surgical  Other Topics Concern   Not on file  Social History Narrative   Retired, goes to [employer] --- no    Social Drivers of Corporate Investment Banker Strain: Low Risk  (11/09/2024)   Overall Financial Resource Strain (CARDIA)    Difficulty of Paying Living Expenses: Not very hard  Food Insecurity: Patient Declined (11/09/2024)   Hunger Vital Sign    Worried About Running Out of Food in the Last Year: Patient declined    Barista  in the Last Year: Patient declined  Transportation Needs: No Transportation Needs (11/09/2024)   PRAPARE - Administrator, Civil Service (Medical): No    Lack of Transportation (Non-Medical): No  Physical Activity: Insufficiently Active (11/09/2024)   Exercise Vital Sign    Days of Exercise per Week: 4 days    Minutes of Exercise per Session: 30 min  Stress: Stress Concern Present (11/09/2024)   [employer] of Occupational Health - Occupational Stress Questionnaire    Feeling of Stress: To some extent  Social Connections: Moderately Integrated (11/09/2024)   Social Connection and Isolation Panel    Frequency of Communication with Friends and Family: Never    Frequency of Social Gatherings with Friends and Family: Never    Attends Religious Services: More than 4 times per year    Active Member of [employer] or Organizations: Yes     Attends Banker Meetings: Not on file    Marital Status: Married  Intimate Partner Violence: Not At Risk (05/22/2024)   Humiliation, Afraid, Rape, and Kick questionnaire    Fear of Current or Ex-Partner: No    Emotionally Abused: No    Physically Abused: No    Sexually Abused: No    Outpatient Medications Prior to Visit  Medication Sig Dispense Refill   albuterol  (PROVENTIL  HFA;VENTOLIN  HFA) 108 (90 Base) MCG/ACT inhaler Inhale 1 puff into the lungs every 6 (six) hours as needed for wheezing or shortness of breath. 1 Inhaler 2   aspirin EC 81 MG tablet Take 81 mg by mouth daily.     budesonide -formoterol  (SYMBICORT ) 160-4.5 MCG/ACT inhaler Inhale 1 puff into the lungs 2 (two) times daily. 3 each 1   calcium carbonate (OS-CAL) 600 MG TABS tablet Take 600 mg by mouth 2 (two) times daily with a meal.     cholecalciferol (VITAMIN D ) 400 units TABS tablet Take 5,000 Units by mouth daily.     lisinopril  (ZESTRIL ) 30 MG tablet TAKE 1 TABLET BY MOUTH EVERY DAY 30 tablet 0   simvastatin  (ZOCOR ) 40 MG tablet Take 1 tablet (40 mg total) by mouth daily. Please schedule an office visit before anymore refills. 90 tablet 1   tiZANidine  (ZANAFLEX ) 4 MG tablet Take 1 tablet (4 mg total) by mouth every 6 (six) hours as needed for muscle spasms. 30 tablet 0   No facility-administered medications prior to visit.    Allergies  Allergen Reactions   Gabapentin Itching   Latex Itching and Rash   Sodium Hypochlorite Itching and Rash    Clorox    Review of Systems  Constitutional:  Negative for fever and malaise/fatigue.  HENT:  Negative for congestion.   Eyes:  Negative for blurred vision.  Respiratory:  Negative for shortness of breath.   Cardiovascular:  Negative for chest pain, palpitations and leg swelling.  Gastrointestinal:  Negative for abdominal pain, blood in stool and nausea.  Genitourinary:  Negative for dysuria and frequency.  Musculoskeletal:  Positive for myalgias.  Negative for falls.  Skin:  Negative for rash.  Neurological:  Negative for dizziness, Webb of consciousness and headaches.  Endo/Heme/Allergies:  Negative for environmental allergies.  Psychiatric/Behavioral:  Negative for depression. The patient is not nervous/anxious.        Objective:    Physical Exam Vitals and nursing note reviewed.  Constitutional:      General: She is not in acute distress.    Appearance: Normal appearance. She is well-developed.  HENT:     Head: Normocephalic and  atraumatic.  Eyes:     General: No scleral icterus.       Right eye: No discharge.        Left eye: No discharge.  Cardiovascular:     Rate and Rhythm: Normal rate and regular rhythm.     Heart sounds: No murmur heard. Pulmonary:     Effort: Pulmonary effort is normal. No respiratory distress.     Breath sounds: Normal breath sounds.  Musculoskeletal:        General: Tenderness present.     Right shoulder: Tenderness present. Decreased range of motion. Normal strength. Normal pulse.       Arms:     Cervical back: Normal range of motion and neck supple.     Right lower leg: No edema.     Left lower leg: No edema.     Comments: Tenderness R deltoid  Decrease abduction due to pain and pain with reaching behind her  Skin:    General: Skin is warm and dry.  Neurological:     Mental Status: She is alert and oriented to person, place, and time.  Psychiatric:        Mood and Affect: Mood normal.        Behavior: Behavior normal.        Thought Content: Thought content normal.        Judgment: Judgment normal.     BP 126/88 (BP Location: Left Arm, Patient Position: Sitting, Cuff Size: Large)   Pulse 84   Temp 98.1 F (36.7 C) (Oral)   Resp 18   Ht 5' 3 (1.6 m)   Wt 189 lb 3.2 oz (85.8 kg)   SpO2 96%   BMI 33.52 kg/m  Wt Readings from Last 3 Encounters:  11/12/24 189 lb 3.2 oz (85.8 kg)  08/16/24 187 lb 9.6 oz (85.1 kg)  05/24/24 189 lb 6.4 oz (85.9 kg)    Diabetic Foot Exam -  Simple   No data filed    Lab Results  Component Value Date   WBC 6.4 08/16/2024   HGB 14.1 08/16/2024   HCT 41.8 08/16/2024   PLT 225.0 08/16/2024   GLUCOSE 100 (H) 08/16/2024   CHOL 145 08/16/2024   TRIG 174.0 (H) 08/16/2024   HDL 49.50 08/16/2024   LDLCALC 60 08/16/2024   ALT 15 08/16/2024   AST 18 08/16/2024   NA 142 08/16/2024   K 4.7 08/16/2024   CL 103 08/16/2024   CREATININE 0.62 08/16/2024   BUN 14 08/16/2024   CO2 31 08/16/2024   TSH 0.79 02/16/2024    Lab Results  Component Value Date   TSH 0.79 02/16/2024   Lab Results  Component Value Date   WBC 6.4 08/16/2024   HGB 14.1 08/16/2024   HCT 41.8 08/16/2024   MCV 92.1 08/16/2024   PLT 225.0 08/16/2024   Lab Results  Component Value Date   NA 142 08/16/2024   K 4.7 08/16/2024   CO2 31 08/16/2024   GLUCOSE 100 (H) 08/16/2024   BUN 14 08/16/2024   CREATININE 0.62 08/16/2024   BILITOT 0.6 08/16/2024   ALKPHOS 118 (H) 08/16/2024   AST 18 08/16/2024   ALT 15 08/16/2024   PROT 6.5 08/16/2024   ALBUMIN 4.3 08/16/2024   CALCIUM 9.7 08/16/2024   ANIONGAP 10 03/25/2021   GFR 86.44 08/16/2024   Lab Results  Component Value Date   CHOL 145 08/16/2024   Lab Results  Component Value Date   HDL 49.50 08/16/2024  Lab Results  Component Value Date   LDLCALC 60 08/16/2024   Lab Results  Component Value Date   TRIG 174.0 (H) 08/16/2024   Lab Results  Component Value Date   CHOLHDL 3 08/16/2024   No results found for: HGBA1C     Assessment & Plan:  Chronic right shoulder pain -     predniSONE ; Take 3 tablets (30 mg total) by mouth daily for 3 days, THEN 2 tablets (20 mg total) daily for 3 days, THEN 1 tablet (10 mg total) daily for 3 days, THEN 0.5 tablets (5 mg total) daily for 3 days.  Dispense: 20 tablet; Refill: 0  Chronic midline low back pain without sciatica -     tiZANidine  HCl; Take 1 tablet (4 mg total) by mouth every 6 (six) hours as needed for muscle spasms.  Dispense: 30 tablet;  Refill: 0  Need for influenza vaccination -     Flu vaccine HIGH DOSE PF(Fluzone Trivalent)  Assessment and Plan Assessment & Plan Right shoulder pain   The pain has persisted for several weeks, likely muscular due to weight exercises, extending from the shoulder to the bicep and worsened by lifting or pulling. No trauma or fall reported. Tylenol has been ineffective. Differential includes muscle strain or tear, though no definitive injury is noted. Prescribe prednisone  for one week to reduce inflammation. Refill muscle relaxer prescription. Advise icing the shoulder at night or alternating with heat. Recommend topical treatments such as Salonpas patch or aspirin with lidocaine. Report back if no improvement in one week for potential referral to sports medicine or orthopedics.  Low back pain   Managed with tizanidine , which has been effective.  Immunization   She desires to receive a flu shot during the visit. Administered flu shot.    Romana Deaton R Lowne Chase, DO

## 2024-11-12 NOTE — Patient Instructions (Signed)
Shoulder Pain Many things can cause shoulder pain, including: An injury to the shoulder. Overuse of the shoulder. Arthritis. The source of the pain can be: Inflammation. An injury to the shoulder joint. An injury to a tendon, ligament, or bone. Follow these instructions at home: Pay attention to changes in your symptoms. Let your health care provider know about them. Follow these instructions to relieve your pain. If you have a removable sling: Wear the sling as told by your provider. Remove it only as told by your provider. Check the skin around the sling every day. Tell your provider about any concerns. Loosen the sling if your fingers tingle, become numb, or become cold. Keep the sling clean. If the sling is not waterproof: Do not let it get wet. Remove it to shower or bathe. Move your arm as little as possible, but keep your hand moving to prevent swelling. Managing pain, stiffness, and swelling  If told, put ice on the painful area. If you have a removable sling or immobilizer, remove it as told by your provider. Put ice in a plastic bag. Place a towel between your skin and the bag. Leave the ice on for 20 minutes, 2-3 times a day. If your skin turns bright red, remove the ice right away to prevent skin damage. The risk of damage is higher if you cannot feel pain, heat, or cold. Move your fingers often to reduce stiffness and swelling. Squeeze a soft ball or a foam pad as much as possible. This helps to keep the shoulder from swelling. It also helps to strengthen the arm. General instructions Take over-the-counter and prescription medicines only as told by your provider. Exercise may help with pain management. Perform exercises if told by your provider. You may be referred to a physical therapist to help in your recovery process. Keep all follow-up visits in order to avoid any type of permanent shoulder disability or chronic pain problems. Contact a health care provider  if: Your pain is not relieved with medicines. New pain develops in your arm, hand, or fingers. You loosen your sling and your arm, hand, or fingers remain tingly, numb, swollen, or painful. Get help right away if: Your arm, hand, or fingers turn white or blue. This information is not intended to replace advice given to you by your health care provider. Make sure you discuss any questions you have with your health care provider. Document Revised: 07/09/2022 Document Reviewed: 07/09/2022 Elsevier Patient Education  2024 [employer]  

## 2024-11-20 ENCOUNTER — Ambulatory Visit: Payer: Self-pay

## 2024-11-20 NOTE — Telephone Encounter (Signed)
FYI Appt made  ?

## 2024-11-20 NOTE — Telephone Encounter (Signed)
  FYI Only or Action Required?: FYI only for provider: appointment scheduled on 12/3.  Patient was last seen in primary care on 11/12/2024 by Antonio Meth, Jamee SAUNDERS, DO.  Called Nurse Triage reporting Fall.  Symptoms began several days ago.  Interventions attempted: OTC medications: Tylenol.  Symptoms are: unchanged.  Triage Disposition: See PCP When Office is Open (Within 3 Days)  Patient/caregiver understands and will follow disposition?: Yes     Copied from CRM #[id number]. Topic: Clinical - Red Word Triage >> Nov 20, 2024  8:46 AM Berneda FALCON wrote: Red Word that prompted transfer to Nurse Triage: Patient states she fell on her back and her tailbone hurts, hurts to bend and sit. This happened on Sunday 11/30 and patient did not go the ED.  She may have hit a chair, bruise on the leg. Hit back and head when she fell. Reason for Disposition  [1] MODERATE back pain (e.g., interferes with normal activities) AND [2] present > 3 days  Answer Assessment - Initial Assessment Questions 1. MECHANISM: How did the fall happen? Tripped over dog least last Sunday       3. ONSET: When did the fall happen? (e.g., minutes, hours, or days ago)      Last Sunday   4. LOCATION: What part of the body hit the ground? (e.g., back, buttocks, head, hips, knees, hands, head, stomach)     Back of body/head as she fell backward   5. INJURY: Did you hurt (injure) yourself when you fell? If Yes, ask: What did you injure? Tell me more about this? (e.g., body area; type of injury; pain severity)     No   6. PAIN: Is there any pain? If Yes, ask: How bad is the pain? (e.g., Scale 0-10; or none, mild,       Pain is in tailbone area 7/10   7. SIZE: For cuts, bruises, or swelling, ask: How large is it? (e.g., inches or centimeters)    Left leg bruise from fall, size: dollar piece    9. OTHER SYMPTOMS: Do you have any other symptoms? (e.g., dizziness, fever, weakness; new-onset or  worsening).      Weakness in legs, BIL   10. CAUSE: What do you think caused the fall (or falling)? (e.g., dizzy spell, tripped)       Tripped over dog leash.   Patient ripped over dog leash the pat Sunday. She fell backwards. Pain is bad in her tailbone area, and she has a bruise on her left upper leg. The pain is moderate. Patient is taking Tylenol for the pain. No respiratory distress noted. She did not seek care at the time of the fall. She is able to bear weight, but did note some weakness in legs, which makes he ambulate slower. Appointment scheduled for evaluation. Patient agrees with plan of care, and will call back if anything changes, or if symptoms worsen.  Protocols used: Falls and Falling-A-AH, Back Pain-A-AH

## 2024-11-21 ENCOUNTER — Ambulatory Visit (HOSPITAL_BASED_OUTPATIENT_CLINIC_OR_DEPARTMENT_OTHER)
Admission: RE | Admit: 2024-11-21 | Discharge: 2024-11-21 | Disposition: A | Source: Ambulatory Visit | Attending: Family | Admitting: Family

## 2024-11-21 ENCOUNTER — Other Ambulatory Visit (HOSPITAL_BASED_OUTPATIENT_CLINIC_OR_DEPARTMENT_OTHER): Payer: Self-pay

## 2024-11-21 ENCOUNTER — Ambulatory Visit: Admitting: Family

## 2024-11-21 ENCOUNTER — Ambulatory Visit: Payer: Self-pay | Admitting: Family

## 2024-11-21 VITALS — BP 132/65 | HR 87 | Temp 97.5°F | Resp 16 | Ht 63.0 in | Wt 186.0 lb

## 2024-11-21 DIAGNOSIS — M545 Low back pain, unspecified: Secondary | ICD-10-CM

## 2024-11-21 DIAGNOSIS — S0990XA Unspecified injury of head, initial encounter: Secondary | ICD-10-CM | POA: Insufficient documentation

## 2024-11-21 DIAGNOSIS — M8589 Other specified disorders of bone density and structure, multiple sites: Secondary | ICD-10-CM

## 2024-11-21 MED ORDER — TRAMADOL HCL 50 MG PO TABS
50.0000 mg | ORAL_TABLET | Freq: Four times a day (QID) | ORAL | 0 refills | Status: AC | PRN
  Filled 2024-11-21: qty 20, 5d supply, fill #0

## 2024-11-21 NOTE — Assessment & Plan Note (Signed)
 Patient is maintained on caltrate.  Last dexa was below frax threshold.

## 2024-11-21 NOTE — Progress Notes (Signed)
 Subjective:     Patient ID: Lindsey Webb, female    DOB: November 28, 1946, 78 y.o.   MRN: [id number]  Chief Complaint  Patient presents with   Fall    Patient reports falling 11/18/24   Tailbone Pain    Patient reports tailbone pain since falling 11/30    Fall    Discussed the use of AI scribe software for clinical note transcription with the patient, who gave verbal consent to proceed.  History of Present Illness Lindsey Webb is a 78 year old female with bone thinning who presents with lumbosacral pain after a fall that occurred on 11/30. She is accompanied by a family member.  She experienced a fall four days ago, potentially due to tripping over a dog leash. During the fall, she landed on her buttocks and subsequently hit her head on a wood floor. She describes significant pain in her lower back, specifically in the coccyx area, and notes that it is painful to sit or lay on her back. Additionally, she mentions soreness in her hips and a bruise on her left thigh from hitting a chair during the fall.  No headaches or tenderness and swelling on her scalp, but tears come out of her eyes when she sits up. She experiences pain when bearing weight on her legs, with most of the pain localized to the bottom of her spine.  Her current medications include prednisone  and a muscle relaxer, which is almost finished. She is unsure if she has taken tramadol before.  She has a history of osteopenia.       Health Maintenance Due  Topic Date Due   Zoster Vaccines- Shingrix (1 of 2) Never done   COVID-19 Vaccine (3 - 2025-26 season) 08/20/2024    Past Medical History:  Diagnosis Date   Anxiety    Arthritis    osteoarthritis   COPD (chronic obstructive pulmonary disease) (HCC)    Depression    History of cervical cancer    Hyperlipidemia    Hypertension    Osteopenia    Sleep apnea    Vitamin D  deficiency     Past Surgical History:  Procedure Laterality Date   ABDOMINAL HYSTERECTOMY   1982   CHOLECYSTECTOMY  1981   TONSILLECTOMY  1962    Family History  Problem Relation Age of Onset   Prostate cancer Father    Brain cancer Father    Colon polyps Father    Brain cancer Mother    Brain cancer Paternal Grandmother     Social History   Socioeconomic History   Marital status: Married    Spouse name: Not on file   Number of children: 2   Years of education: Not on file   Highest education level: GED or equivalent  Occupational History   Occupation: retired  Tobacco Use   Smoking status: Former    Current packs/day: 0.00    Average packs/day: 1 pack/day for 45.0 years (45.0 ttl pk-yrs)    Types: Cigarettes    Start date: 01/12/1971    Quit date: 01/13/2016    Years since quitting: 8.8   Smokeless tobacco: Never  Vaping Use   Vaping status: Never Used  Substance and Sexual Activity   Alcohol use: No   Drug use: No   Sexual activity: Yes    Birth control/protection: Post-menopausal, Surgical  Other Topics Concern   Not on file  Social History Narrative   Retired, goes to [employer] --- no  Social Drivers of Corporate Investment Banker Strain: Low Risk  (11/09/2024)   Overall Financial Resource Strain (CARDIA)    Difficulty of Paying Living Expenses: Not very hard  Food Insecurity: Patient Declined (11/09/2024)   Hunger Vital Sign    Worried About Running Out of Food in the Last Year: Patient declined    Ran Out of Food in the Last Year: Patient declined  Transportation Needs: No Transportation Needs (11/09/2024)   PRAPARE - Administrator, Civil Service (Medical): No    Lack of Transportation (Non-Medical): No  Physical Activity: Insufficiently Active (11/09/2024)   Exercise Vital Sign    Days of Exercise per Week: 4 days    Minutes of Exercise per Session: 30 min  Stress: Stress Concern Present (11/09/2024)   [employer] of Occupational Health - Occupational Stress Questionnaire    Feeling of Stress: To some extent   Social Connections: Moderately Integrated (11/09/2024)   Social Connection and Isolation Panel    Frequency of Communication with Friends and Family: Never    Frequency of Social Gatherings with Friends and Family: Never    Attends Religious Services: More than 4 times per year    Active Member of [employer] or Organizations: Yes    Attends Banker Meetings: Not on file    Marital Status: Married  Intimate Partner Violence: Not At Risk (05/22/2024)   Humiliation, Afraid, Rape, and Kick questionnaire    Fear of Current or Ex-Partner: No    Emotionally Abused: No    Physically Abused: No    Sexually Abused: No    Outpatient Medications Prior to Visit  Medication Sig Dispense Refill   albuterol  (PROVENTIL  HFA;VENTOLIN  HFA) 108 (90 Base) MCG/ACT inhaler Inhale 1 puff into the lungs every 6 (six) hours as needed for wheezing or shortness of breath. 1 Inhaler 2   aspirin EC 81 MG tablet Take 81 mg by mouth daily.     budesonide -formoterol  (SYMBICORT ) 160-4.5 MCG/ACT inhaler Inhale 1 puff into the lungs 2 (two) times daily. 3 each 1   calcium carbonate (OS-CAL) 600 MG TABS tablet Take 600 mg by mouth 2 (two) times daily with a meal.     cholecalciferol (VITAMIN D ) 400 units TABS tablet Take 5,000 Units by mouth daily.     lisinopril  (ZESTRIL ) 30 MG tablet TAKE 1 TABLET BY MOUTH EVERY DAY 30 tablet 0   predniSONE  (DELTASONE ) 10 MG tablet Take 3 tablets (30 mg total) by mouth daily for 3 days, THEN 2 tablets (20 mg total) daily for 3 days, THEN 1 tablet (10 mg total) daily for 3 days, THEN 0.5 tablets (5 mg total) daily for 3 days. 20 tablet 0   simvastatin  (ZOCOR ) 40 MG tablet Take 1 tablet (40 mg total) by mouth daily. Please schedule an office visit before anymore refills. 90 tablet 1   tiZANidine  (ZANAFLEX ) 4 MG tablet Take 1 tablet (4 mg total) by mouth every 6 (six) hours as needed for muscle spasms. 30 tablet 0   No facility-administered medications prior to visit.    Allergies   Allergen Reactions   Gabapentin Itching   Latex Itching and Rash   Sodium Hypochlorite Itching and Rash    Clorox    ROS    See HPI Objective:    Physical Exam Constitutional:      General: She is not in acute distress.    Appearance: Normal appearance. She is well-developed.  HENT:     Head: Normocephalic and  atraumatic.     Right Ear: External ear normal.     Left Ear: External ear normal.  Eyes:     General: No scleral icterus. Neck:     Thyroid : No thyromegaly.  Cardiovascular:     Rate and Rhythm: Normal rate and regular rhythm.     Heart sounds: Normal heart sounds. No murmur heard. Pulmonary:     Effort: Pulmonary effort is normal. No respiratory distress.     Breath sounds: Normal breath sounds. No wheezing.  Musculoskeletal:     Cervical back: Neck supple.  Skin:    General: Skin is warm and dry.  Neurological:     General: No focal deficit present.     Mental Status: She is alert and oriented to person, place, and time.  Psychiatric:        Mood and Affect: Mood normal.        Behavior: Behavior normal.        Thought Content: Thought content normal.        Judgment: Judgment normal.      BP 132/65 (BP Location: Right Arm, Patient Position: Sitting, Cuff Size: Normal)   Pulse 87   Temp (!) 97.5 F (36.4 C) (Oral)   Resp 16   Ht 5' 3 (1.6 m)   Wt 186 lb (84.4 kg)   SpO2 97%   BMI 32.95 kg/m  Wt Readings from Last 3 Encounters:  11/21/24 186 lb (84.4 kg)  11/12/24 189 lb 3.2 oz (85.8 kg)  08/16/24 187 lb 9.6 oz (85.1 kg)       Assessment & Plan:   Problem List Items Addressed This Visit       Unprioritized   Osteopenia   Patient is maintained on caltrate.  Last dexa was below frax threshold.       Head trauma - Primary    Head impact with potential risk of internal bleeding. - Ordered head CT to rule out internal bleeding.       Relevant Orders   CT HEAD WO CONTRAST ( )   Acute midline low back pain without sciatica     Acute pain in the coccyx and lower back post-fall, exacerbated by sitting and weight-bearing. Increased fracture risk due to bone thinning. - Ordered x-rays of the lower back, tailbone, and hips. - Prescribed tramadol for pain management, 5-day supply. - Advised follow-up if pain persists after 5 days.      Relevant Medications   traMADol (ULTRAM) 50 MG tablet   Other Relevant Orders   DG Lumbar Spine Complete   DG HIPS BILAT WITH PELVIS 2V   DG Sacrum/Coccyx   Assessment & Plan     I am having Lindsey Webb start on traMADol. I am also having her maintain her calcium carbonate, aspirin EC, cholecalciferol, albuterol , simvastatin , lisinopril , budesonide -formoterol , predniSONE , and tiZANidine .  Meds ordered this encounter  Medications   traMADol (ULTRAM) 50 MG tablet    Sig: Take 1 tablet (50 mg total) by mouth every 6 (six) hours as needed for up to 5 days.    Dispense:  20 tablet    Refill:  0    Supervising Provider:   DOMENICA BLACKBIRD A [[id number]]

## 2024-11-21 NOTE — Assessment & Plan Note (Signed)
  Acute pain in the coccyx and lower back post-fall, exacerbated by sitting and weight-bearing. Increased fracture risk due to bone thinning. - Ordered x-rays of the lower back, tailbone, and hips. - Prescribed tramadol for pain management, 5-day supply. - Advised follow-up if pain persists after 5 days.

## 2024-11-21 NOTE — Assessment & Plan Note (Signed)
  Head impact with potential risk of internal bleeding. - Ordered head CT to rule out internal bleeding.

## 2024-11-21 NOTE — Telephone Encounter (Signed)
 Advised pt that all her imaging is normal. She is advised to let us  know if pain is not improved in 1 week.

## 2024-11-21 NOTE — Patient Instructions (Signed)
  VISIT SUMMARY: During your visit, we discussed the pain you are experiencing after your recent fall. You mentioned significant pain in your lower back and coccyx area, as well as soreness in your hips and a bruise on your left thigh. We also reviewed your history of bone thinning and the potential risk of fractures.  YOUR PLAN: -LOW BACK PAIN AND COCCYX PAIN AFTER FALL: You have acute pain in your lower back and coccyx area following your fall. This pain is worsened by sitting and putting weight on your legs. Given your history of bone thinning, there is an increased risk of fractures. We have ordered x-rays of your lower back, tailbone, and hips to check for any fractures. You have been prescribed tramadol  for pain management for the next 5 days. Please follow up if the pain persists after 5 days.  -UNSPECIFIED HEAD INJURY AFTER FALL: You hit your head during the fall, which could potentially lead to internal bleeding. To ensure there is no internal bleeding, we have ordered a head CT scan.  -AGE-RELATED OSTEOPOROSIS: You have bone thinning, which increases your risk of fractures. This condition requires careful management to prevent injuries.  INSTRUCTIONS: Please get the x-rays of your lower back, tailbone, and hips done as soon as possible. Additionally, schedule a head CT scan to rule out any internal bleeding. Follow up with us  if your pain persists after 5 days or if you experience any new symptoms.

## 2025-02-18 ENCOUNTER — Ambulatory Visit: Admitting: Family Medicine

## 2025-05-28 ENCOUNTER — Ambulatory Visit
